# Patient Record
Sex: Female | Born: 2000 | Race: White | Hispanic: No | Marital: Single | Smoking: Never smoker
Health system: Southern US, Community
[De-identification: ages and names within clinical notes are randomized; demographics above are authoritative.]

## PROBLEM LIST (undated history)

## (undated) DIAGNOSIS — Z789 Other specified health status: Secondary | ICD-10-CM

## (undated) HISTORY — PX: NO PAST SURGERIES: SHX2092

## (undated) HISTORY — DX: Other specified health status: Z78.9

---
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
## 2019-02-09 ENCOUNTER — Encounter: Payer: Self-pay | Admitting: Family Medicine

## 2019-02-10 ENCOUNTER — Encounter: Payer: Self-pay | Admitting: Family Medicine

## 2019-02-25 ENCOUNTER — Ambulatory Visit (INDEPENDENT_AMBULATORY_CARE_PROVIDER_SITE_OTHER): Payer: Self-pay | Admitting: Nurse Practitioner

## 2019-02-25 ENCOUNTER — Other Ambulatory Visit: Payer: Self-pay

## 2019-02-25 ENCOUNTER — Encounter: Payer: Self-pay | Admitting: Nurse Practitioner

## 2019-02-25 VITALS — BP 124/86 | Temp 97.4°F | Ht 68.0 in | Wt 166.6 lb

## 2019-02-25 DIAGNOSIS — Z3009 Encounter for other general counseling and advice on contraception: Secondary | ICD-10-CM

## 2019-02-25 DIAGNOSIS — N390 Urinary tract infection, site not specified: Secondary | ICD-10-CM

## 2019-02-25 LAB — POCT URINALYSIS DIPSTICK
Spec Grav, UA: 1.01 (ref 1.010–1.025)
pH, UA: 7 (ref 5.0–8.0)

## 2019-02-25 MED ORDER — MEDROXYPROGESTERONE ACETATE 150 MG/ML IM SUSY: 150.0000 mg | PREFILLED_SYRINGE | INTRAMUSCULAR | 0 refills | Status: DC

## 2019-02-25 MED ORDER — NITROFURANTOIN MONOHYD MACRO 100 MG PO CAPS: 100.0000 mg | ORAL_CAPSULE | Freq: Two times a day (BID) | ORAL | 0 refills | Status: DC

## 2019-02-25 NOTE — Progress Notes (Signed)
   Subjective:    Patient ID: Samantha Patterson, female    DOB: 12-19-2000, 19 y.o.   MRN: [id number]  HPIfrequent urination started 2 days. alos would like to be checked for STD's.  Results for orders placed or performed in visit on 02/25/19  POCT urinalysis dipstick  Result Value Ref Range   Color, UA     Clarity, UA     Glucose, UA     Bilirubin, UA     Ketones, UA     Spec Grav, UA 1.010 1.010 - 1.025   Blood, UA     pH, UA 7.0 5.0 - 8.0   Protein, UA     Urobilinogen, UA     Nitrite, UA     Leukocytes, UA Trace (A) Negative   Appearance     Odor        Review of Systems     Objective:   Physical Exam        Assessment & Plan:

## 2019-02-25 NOTE — Patient Instructions (Signed)
Family Planning Medicaid

## 2019-02-25 NOTE — Progress Notes (Signed)
Subjective:    Patient ID: Samantha Patterson, female    DOB: 06-23-2000, 19 y.o.   MRN: [id number]  HPI  19 year old Caucasian female presents with complaints of urinary frequency times 3 days. Had some slight blood with wiping yesterday but unsure if it was urinary or vaginal. Denies flank pain, burning or itching. States she took OTC ibuprofen yesterday for cramping with minimum effectiveness. Has increased fluids of water and cranberry juice. States she is sexually active with the same female partner for 6 months. Depo-Provera last taken 02/19/19.  Stated she has no medical insurance. No history of recent UTI.   Review of Systems  Constitutional: Negative for activity change, fatigue and fever.  Gastrointestinal: Negative for diarrhea, nausea and vomiting.  Genitourinary: Positive for frequency and urgency. Negative for difficulty urinating, dyspareunia, dysuria, enuresis, flank pain, genital sores, pelvic pain, vaginal discharge and vaginal pain.       Objective:   Physical Exam Constitutional:      Appearance: Normal appearance.  Cardiovascular:     Rate and Rhythm: Normal rate and regular rhythm.  Pulmonary:     Breath sounds: Normal breath sounds.  Abdominal:     General: Abdomen is flat. There is no distension.     Palpations: Abdomen is soft. There is no mass.     Tenderness: There is no abdominal tenderness. There is no guarding.  Neurological:     Mental Status: She is alert.    Defers pelvic exam.  Results for orders placed or performed in visit on 02/25/19  POCT urinalysis dipstick  Result Value Ref Range   Color, UA     Clarity, UA     Glucose, UA     Bilirubin, UA     Ketones, UA     Spec Grav, UA 1.010 1.010 - 1.025   Blood, UA     pH, UA 7.0 5.0 - 8.0   Protein, UA     Urobilinogen, UA     Nitrite, UA     Leukocytes, UA Trace (A) Negative   Appearance     Odor      Urine micro: 0-4 WBC per HPF; no RBC; rare epithelial; urine very dilute         Assessment & Plan:  Urinary tract infection in female - Plan: POCT urinalysis dipstick  Encounter for general counseling and advice on contraceptive management   Meds ordered this encounter  Medications  . medroxyPROGESTERone Acetate 150 MG/ML SUSY    Sig: Inject 1 mL (150 mg total) into the muscle every 3 (three) months.    Dispense:  1 mL    Refill:  0    Please consider 90 day supplies to promote better adherence    Order Specific Question:   Supervising Provider    Answer:   Sallee Lange A [[id number]]  . nitrofurantoin, macrocrystal-monohydrate, (MACROBID) 100 MG capsule    Sig: Take 1 capsule (100 mg total) by mouth 2 (two) times daily.    Dispense:  14 capsule    Refill:  0    Order Specific Question:   Supervising Provider    Answer:   Kathyrn Drown [[id number]]   Defers STD testing at this time due to lack of insurance.  Discussed other contraceptive methods, risks, benefits and potential costs. Patient decided to complete outreach to [facility] services for further contraceptive options and STD evaluation. If she does not qualify for this, to seek care at [facility] due to  finances. Patient noted that she wished to continue Depo-Provera until Family Planing visit.  Warning signs reviewed. Call back in the meantime if symptoms worsen or persist.

## 2019-02-26 ENCOUNTER — Encounter: Payer: Self-pay | Admitting: Nurse Practitioner

## 2019-08-22 ENCOUNTER — Encounter: Payer: Self-pay | Admitting: Adult Health

## 2019-08-22 ENCOUNTER — Ambulatory Visit (INDEPENDENT_AMBULATORY_CARE_PROVIDER_SITE_OTHER): Payer: Self-pay | Admitting: Adult Health

## 2019-08-22 VITALS — BP 122/78 | HR 92 | Ht 68.0 in | Wt 180.0 lb

## 2019-08-22 DIAGNOSIS — Z3A01 Less than 8 weeks gestation of pregnancy: Secondary | ICD-10-CM | POA: Insufficient documentation

## 2019-08-22 DIAGNOSIS — Z3201 Encounter for pregnancy test, result positive: Secondary | ICD-10-CM

## 2019-08-22 DIAGNOSIS — O3680X0 Pregnancy with inconclusive fetal viability, not applicable or unspecified: Secondary | ICD-10-CM

## 2019-08-22 LAB — POCT URINE PREGNANCY: Preg Test, Ur: POSITIVE — AB

## 2019-08-22 MED ORDER — FLINTSTONES COMPLETE 18 MG PO CHEW: CHEWABLE_TABLET | ORAL | Status: DC

## 2019-08-22 NOTE — Progress Notes (Signed)
°  Subjective:     Patient ID: Samantha Patterson, female   DOB: 2000/04/20, 19 y.o.   MRN: [id number]  HPI Samantha Patterson is a 19 year old white female, single,in for UPT has missed a period and had 4 +HPTs.  PCP is Dr Wolfgang Phoenix.  Review of Systems +missed period with 4+HPTs Some nausea Reviewed past medical,surgical, social and family history. Reviewed medications and allergies.     Objective:   Physical Exam BP 122/78 (BP Location: Left Arm, Patient Position: Sitting, Cuff Size: Normal)    Pulse 92    Ht 5\' 8"  (1.727 m)    Wt 180 lb (81.6 kg)    LMP 07/05/2019    BMI 27.37 kg/m UPT is +, about 6+6 weeks by LMP with EDD 04/10/20.   Skin warm and dry. Neck: mid line trachea, normal thyroid, good ROM, no lymphadenopathy noted. Lungs: clear to ausculation bilaterally. Cardiovascular: regular rate and rhythm.Abdomen is soft and non tender Fall risk is low  Upstream - 08/22/19 1607      Pregnancy Intention Screening   Does the patient want to become pregnant in the next year? Yes    Does the patient's partner want to become pregnant in the next year? Yes    Would the patient like to discuss contraceptive options today? No      Contraception Wrap Up   Current Method Pregnant/Seeking Pregnancy    End Method Pregnant/Seeking Pregnancy    Contraception Counseling Provided No             Assessment:     1. Pregnancy examination or test, positive result Try 2 flintstones daily  2. Less than [redacted] weeks gestation of pregnancy Eat small frequent meals  3. Encounter to determine fetal viability of pregnancy, single or unspecified fetus Return in 1 week for dating [place]    Plan:     Review handout on First trimester and by [facility]

## 2019-08-22 NOTE — Patient Instructions (Signed)
First Trimester of Pregnancy The first trimester of pregnancy is from week 1 until the end of week 13 (months 1 through 3). A week after a sperm fertilizes an egg, the egg will implant on the wall of the uterus. This embryo will begin to develop into a baby. Genes from you and your partner will form the baby. The female genes will determine whether the baby will be a boy or a girl. At 6-8 weeks, the eyes and face will be formed, and the heartbeat can be seen on ultrasound. At the end of 12 weeks, all the baby's organs will be formed. Now that you are pregnant, you will want to do everything you can to have a healthy baby. Two of the most important things are to get good prenatal care and to follow your health care provider's instructions. Prenatal care is all the medical care you receive before the baby's birth. This care will help prevent, find, and treat any problems during the pregnancy and childbirth. Body changes during your first trimester Your body goes through many changes during pregnancy. The changes vary from woman to woman.  You may gain or lose a couple of pounds at first.  You may feel sick to your stomach (nauseous) and you may throw up (vomit). If the vomiting is uncontrollable, call your health care provider.  You may tire easily.  You may develop headaches that can be relieved by medicines. All medicines should be approved by your health care provider.  You may urinate more often. Painful urination may mean you have a bladder infection.  You may develop heartburn as a result of your pregnancy.  You may develop constipation because certain hormones are causing the muscles that push stool through your intestines to slow down.  You may develop hemorrhoids or swollen veins (varicose veins).  Your breasts may begin to grow larger and become tender. Your nipples may stick out more, and the tissue that surrounds them (areola) may become darker.  Your gums may bleed and may be  sensitive to brushing and flossing.  Dark spots or blotches (chloasma, mask of pregnancy) may develop on your face. This will likely fade after the baby is born.  Your menstrual periods will stop.  You may have a loss of appetite.  You may develop cravings for certain kinds of food.  You may have changes in your emotions from day to day, such as being excited to be pregnant or being concerned that something may go wrong with the pregnancy and baby.  You may have more vivid and strange dreams.  You may have changes in your hair. These can include thickening of your hair, rapid growth, and changes in texture. Some women also have hair loss during or after pregnancy, or hair that feels dry or thin. Your hair will most likely return to normal after your baby is born. What to expect at prenatal visits During a routine prenatal visit:  You will be weighed to make sure you and the baby are growing normally.  Your blood pressure will be taken.  Your abdomen will be measured to track your baby's growth.  The fetal heartbeat will be listened to between weeks 10 and 14 of your pregnancy.  Test results from any previous visits will be discussed. Your health care provider may ask you:  How you are feeling.  If you are feeling the baby move.  If you have had any abnormal symptoms, such as leaking fluid, bleeding, severe headaches, or abdominal   cramping.  If you are using any tobacco products, including cigarettes, chewing tobacco, and electronic cigarettes.  If you have any questions. Other tests that may be performed during your first trimester include:  Blood tests to find your blood type and to check for the presence of any previous infections. The tests will also be used to check for low iron levels (anemia) and protein on red blood cells (Rh antibodies). Depending on your risk factors, or if you previously had diabetes during pregnancy, you may have tests to check for high blood sugar  that affects pregnant women (gestational diabetes).  Urine tests to check for infections, diabetes, or protein in the urine.  An ultrasound to confirm the proper growth and development of the baby.  Fetal screens for spinal cord problems (spina bifida) and Down syndrome.  HIV (human immunodeficiency virus) testing. Routine prenatal testing includes screening for HIV, unless you choose not to have this test.  You may need other tests to make sure you and the baby are doing well. Follow these instructions at home: Medicines  Follow your health care provider's instructions regarding medicine use. Specific medicines may be either safe or unsafe to take during pregnancy.  Take a prenatal vitamin that contains at least 600 micrograms (mcg) of folic acid.  If you develop constipation, try taking a stool softener if your health care provider approves. Eating and drinking   Eat a balanced diet that includes fresh fruits and vegetables, whole grains, good sources of protein such as meat, eggs, or tofu, and low-fat dairy. Your health care provider will help you determine the amount of weight gain that is right for you.  Avoid raw meat and uncooked cheese. These carry germs that can cause birth defects in the baby.  Eating four or five small meals rather than three large meals a day may help relieve nausea and vomiting. If you start to feel nauseous, eating a few soda crackers can be helpful. Drinking liquids between meals, instead of during meals, also seems to help ease nausea and vomiting.  Limit foods that are high in fat and processed sugars, such as fried and sweet foods.  To prevent constipation: ? Eat foods that are high in fiber, such as fresh fruits and vegetables, whole grains, and beans. ? Drink enough fluid to keep your urine clear or pale yellow. Activity  Exercise only as directed by your health care provider. Most women can continue their usual exercise routine during  pregnancy. Try to exercise for 30 minutes at least 5 days a week. Exercising will help you: ? Control your weight. ? Stay in shape. ? Be prepared for labor and delivery.  Experiencing pain or cramping in the lower abdomen or lower back is a good sign that you should stop exercising. Check with your health care provider before continuing with normal exercises.  Try to avoid standing for long periods of time. Move your legs often if you must stand in one place for a long time.  Avoid heavy lifting.  Wear low-heeled shoes and practice good posture.  You may continue to have sex unless your health care provider tells you not to. Relieving pain and discomfort  Wear a good support bra to relieve breast tenderness.  Take warm sitz baths to soothe any pain or discomfort caused by hemorrhoids. Use hemorrhoid cream if your health care provider approves.  Rest with your legs elevated if you have leg cramps or low back pain.  If you develop varicose veins in   your legs, wear support hose. Elevate your feet for 15 minutes, 3-4 times a day. Limit salt in your diet. Prenatal care  Schedule your prenatal visits by the twelfth week of pregnancy. They are usually scheduled monthly at first, then more often in the last 2 months before delivery.  Write down your questions. Take them to your prenatal visits.  Keep all your prenatal visits as told by your health care provider. This is important. Safety  Wear your seat belt at all times when driving.  Make a list of emergency phone numbers, including numbers for family, friends, the hospital, and police and fire departments. General instructions  Ask your health care provider for a referral to a local prenatal education class. Begin classes no later than the beginning of month 6 of your pregnancy.  Ask for help if you have counseling or nutritional needs during pregnancy. Your health care provider can offer advice or refer you to specialists for help  with various needs.  Do not use hot tubs, steam rooms, or saunas.  Do not douche or use tampons or scented sanitary pads.  Do not cross your legs for long periods of time.  Avoid cat litter boxes and soil used by cats. These carry germs that can cause birth defects in the baby and possibly loss of the fetus by miscarriage or stillbirth.  Avoid all smoking, herbs, alcohol, and medicines not prescribed by your health care provider. Chemicals in these products affect the formation and growth of the baby.  Do not use any products that contain nicotine or tobacco, such as cigarettes and e-cigarettes. If you need help quitting, ask your health care provider. You may receive counseling support and other resources to help you quit.  Schedule a dentist appointment. At home, brush your teeth with a soft toothbrush and be gentle when you floss. Contact a health care provider if:  You have dizziness.  You have mild pelvic cramps, pelvic pressure, or nagging pain in the abdominal area.  You have persistent nausea, vomiting, or diarrhea.  You have a bad smelling vaginal discharge.  You have pain when you urinate.  You notice increased swelling in your face, hands, legs, or ankles.  You are exposed to fifth disease or chickenpox.  You are exposed to German measles (rubella) and have never had it. Get help right away if:  You have a fever.  You are leaking fluid from your vagina.  You have spotting or bleeding from your vagina.  You have severe abdominal cramping or pain.  You have rapid weight gain or loss.  You vomit blood or material that looks like coffee grounds.  You develop a severe headache.  You have shortness of breath.  You have any kind of trauma, such as from a fall or a car accident. Summary  The first trimester of pregnancy is from week 1 until the end of week 13 (months 1 through 3).  Your body goes through many changes during pregnancy. The changes vary from  woman to woman.  You will have routine prenatal visits. During those visits, your health care provider will examine you, discuss any test results you may have, and talk with you about how you are feeling. This information is not intended to replace advice given to you by your health care provider. Make sure you discuss any questions you have with your health care provider. Document Revised: 12/12/2016 Document Reviewed: 12/12/2015 Elsevier Patient Education  2020 [employer]  

## 2019-08-31 ENCOUNTER — Other Ambulatory Visit: Payer: Self-pay

## 2019-09-05 ENCOUNTER — Ambulatory Visit (INDEPENDENT_AMBULATORY_CARE_PROVIDER_SITE_OTHER): Payer: Self-pay

## 2019-09-05 DIAGNOSIS — O3680X0 Pregnancy with inconclusive fetal viability, not applicable or unspecified: Secondary | ICD-10-CM

## 2019-09-05 NOTE — Progress Notes (Signed)
[place] 8+6 wks,single IUP with YS,normal right ovary,left ovary not visualized,crl 19.47 mm,fhr 158 bpm

## 2019-09-30 ENCOUNTER — Other Ambulatory Visit: Payer: Self-pay | Admitting: Obstetrics & Gynecology

## 2019-09-30 DIAGNOSIS — Z3682 Encounter for antenatal screening for nuchal translucency: Secondary | ICD-10-CM

## 2019-10-03 ENCOUNTER — Encounter: Payer: Self-pay | Admitting: Women's Health

## 2019-10-03 ENCOUNTER — Ambulatory Visit (INDEPENDENT_AMBULATORY_CARE_PROVIDER_SITE_OTHER): Payer: Medicaid Other | Admitting: Women's Health

## 2019-10-03 ENCOUNTER — Ambulatory Visit (INDEPENDENT_AMBULATORY_CARE_PROVIDER_SITE_OTHER): Payer: Medicaid Other

## 2019-10-03 ENCOUNTER — Ambulatory Visit: Payer: Medicaid Other | Admitting: *Deleted

## 2019-10-03 VITALS — BP 120/82 | HR 90 | Wt 173.0 lb

## 2019-10-03 DIAGNOSIS — Z3A12 12 weeks gestation of pregnancy: Secondary | ICD-10-CM

## 2019-10-03 DIAGNOSIS — Z3400 Encounter for supervision of normal first pregnancy, unspecified trimester: Secondary | ICD-10-CM | POA: Insufficient documentation

## 2019-10-03 DIAGNOSIS — Z1389 Encounter for screening for other disorder: Secondary | ICD-10-CM | POA: Diagnosis not present

## 2019-10-03 DIAGNOSIS — Z331 Pregnant state, incidental: Secondary | ICD-10-CM

## 2019-10-03 DIAGNOSIS — F419 Anxiety disorder, unspecified: Secondary | ICD-10-CM

## 2019-10-03 DIAGNOSIS — Z3401 Encounter for supervision of normal first pregnancy, first trimester: Secondary | ICD-10-CM

## 2019-10-03 DIAGNOSIS — Z3682 Encounter for antenatal screening for nuchal translucency: Secondary | ICD-10-CM | POA: Diagnosis not present

## 2019-10-03 LAB — POCT URINALYSIS DIPSTICK OB
Blood, UA: NEGATIVE
Glucose, UA: NEGATIVE
Ketones, UA: NEGATIVE
Leukocytes, UA: NEGATIVE
Nitrite, UA: NEGATIVE
POC,PROTEIN,UA: NEGATIVE

## 2019-10-03 MED ORDER — DOXYLAMINE-PYRIDOXINE 10-10 MG PO TBEC: DELAYED_RELEASE_TABLET | ORAL | 6 refills | Status: DC

## 2019-10-03 NOTE — Progress Notes (Signed)
   NURSE VISIT- [facility]  SUBJECTIVE:  Samantha Patterson is a 19 y.o. G1P0 female here for Panorama NIPT and Horizon Carrier Screening . She is [redacted]w[redacted]d pregnant.   OBJECTIVE:  Appears well, in no apparent distress  Blood work drawn from right [facility] without difficulty. 1 attempt(s).   ASSESSMENT: Pregnancy [redacted]w[redacted]d Panorama NIPT and Horizon Carrier Screening  PLAN: [facility] portal information given and instructed patient how to access results   Alice Rieger  10/03/2019 10:32 AM

## 2019-10-03 NOTE — Progress Notes (Signed)
[place] 12+6 wks,measurements c/w dates,crl 70.34 mm,fhr 155 bpm,NB present,NT 1.2 mm,normal ovaries,anterior placenta

## 2019-10-03 NOTE — Progress Notes (Signed)
INITIAL OBSTETRICAL VISIT Patient name: [name] MRN [id number]  Date of birth: [birth year] Chief Complaint:   Initial Prenatal Visit (nt/it)  History of Present Illness:   Samantha Patterson is a 19 y.o. G64P0 Caucasian female at [redacted]w[redacted]d by LMP c/w u/s at 8 weeks with an Estimated Date of Delivery: 04/10/20 being seen today for her initial obstetrical visit.   Her obstetrical history is significant for primigravida.   Today she reports n/v- requests meds.  H/O anxiety- no meds, doing well Depression screen [facility] 2/9 10/03/2019 11/23/2013  Decreased Interest 2 2  Down, Depressed, Hopeless 1 3  PHQ - 2 Score 3 5  Altered sleeping 2 3  Tired, decreased energy 2 2  Change in appetite 2 3  Feeling bad or failure about yourself  2 2  Trouble concentrating 2 2  Moving slowly or fidgety/restless 0 2  Suicidal thoughts 0 0  PHQ-9 Score 13 19    Patient's last menstrual period was 07/05/2019. Last pap <21yo. Results were: n/a Review of Systems:   Pertinent items are noted in HPI Denies cramping/contractions, leakage of fluid, vaginal bleeding, abnormal vaginal discharge w/ itching/odor/irritation, headaches, visual changes, shortness of breath, chest pain, abdominal pain, severe nausea/vomiting, or problems with urination or bowel movements unless otherwise stated above.  Pertinent History Reviewed:  Reviewed past medical,surgical, social, obstetrical and family history.  Reviewed problem list, medications and allergies. OB History  Gravida Para Term Preterm AB Living  1            SAB TAB Ectopic Multiple Live Births               # Outcome Date GA Lbr Len/2nd Weight Sex Delivery Anes PTL Lv  1 Current            Physical Assessment:   Vitals:   10/03/19 1036  BP: 120/82  Pulse: 90  Weight: 173 lb (78.5 kg)  Body mass index is 26.3 kg/m.       Physical Examination:  General appearance - well appearing, and in no distress  Mental status - alert, oriented to person, place, and  time  Psych:  She has a normal mood and affect  Skin - warm and dry, normal color, no suspicious lesions noted  Chest - effort normal, all lung fields clear to auscultation bilaterally  Heart - normal rate and regular rhythm  Abdomen - soft, nontender  Extremities:  No swelling or varicosities noted  Thin prep pap is not done  TODAY'S NT [place] 12+6 wks,measurements c/w dates,crl 70.34 mm,fhr 155 bpm,NB present,NT 1.2 mm,normal ovaries,anterior placenta   Results for orders placed or performed in visit on 10/03/19 (from the past 24 hour(s))  POC Urinalysis Dipstick OB   Collection Time: 10/03/19 10:43 AM  Result Value Ref Range   Color, UA     Clarity, UA     Glucose, UA Negative Negative   Bilirubin, UA     Ketones, UA neg    Spec Grav, UA     Blood, UA neg    pH, UA     POC,PROTEIN,UA Negative Negative, Trace, Small (1+), Moderate (2+), Large (3+), 4+   Urobilinogen, UA     Nitrite, UA neg    Leukocytes, UA Negative Negative   Appearance     Odor      Assessment & Plan:  1) Low-Risk Pregnancy G1P0 at [redacted]w[redacted]d with an Estimated Date of Delivery: 04/10/20   2) Initial OB visit  3) N/V> rx  diclegis  4) H/O anxiety> no meds, doing well  Meds:  Meds ordered this encounter  Medications  . Doxylamine-Pyridoxine (DICLEGIS) 10-10 MG TBEC    Sig: 2 tabs q hs, if sx persist add 1 tab q am on day 3, if sx persist add 1 tab q afternoon on day 4    Dispense:  100 tablet    Refill:  6    Order Specific Question:   Supervising Provider    Answer:   Tania Ade H [[id number]]    Initial labs obtained Continue prenatal vitamins Reviewed n/v relief measures and warning s/s to report Reviewed recommended weight gain based on pre-gravid BMI Encouraged well-balanced diet Genetic & carrier screening discussed: requests Panorama, NT/IT and Horizon 14  Ultrasound discussed; fetal survey: requested CCNC completed> form faxed if has or is planning to apply for medicaid The nature of [employer] for [employer] with multiple MDs and other Advanced Practice Providers was explained to patient; also emphasized that fellows, residents, and students are part of our team. Has home bp cuff.  Check bp weekly, let us know if >140/90.   Follow-up: Return in about 3 weeks (around 10/24/2019) for [place], 2nd IT, in person, CNM.   Orders Placed This Encounter  Procedures  . Urine Culture  . GC/Chlamydia Probe Amp  . Integrated 1  . Genetic Screening  . Pain Management Screening Profile (10S)  . CBC/D/Plt+RPR+Rh+ABO+Rub Ab...  . POC Urinalysis Dipstick OB    [name] CNM, [facility] 10/03/2019 10:56 AM

## 2019-10-03 NOTE — Patient Instructions (Signed)
Samantha Patterson, I greatly value your feedback.  If you receive a survey following your visit with [place] today, we appreciate you taking the time to fill it out.  Thanks, [name], CNM, WHNP-BC   [facility] [place] [facility] ([address], [place] [address]) Entrance C, located off of [address] [place] parking   Nausea & Vomiting  Have saltine crackers or pretzels by your bed and eat a few bites before you raise your head out of bed in the morning  Eat small frequent meals throughout the day instead of large meals  Drink plenty of fluids throughout the day to stay hydrated, just don't drink a lot of fluids with your meals.  This can make your stomach fill up faster making you feel sick  Do not brush your teeth right after you eat  Products with real ginger are good for nausea, like ginger ale and ginger hard candy Make sure it says made with real ginger!  Sucking on sour candy like lemon heads is also good for nausea  If your prenatal vitamins make you nauseated, take them at night so you will sleep through the nausea  Sea Bands  If you feel like you need medicine for the nausea & vomiting please let us know  If you are unable to keep any fluids or food down please let us know   Constipation  Drink plenty of fluid, preferably water, throughout the day  Eat foods high in fiber such as fruits, vegetables, and grains  Exercise, such as walking, is a good way to keep your bowels regular  Drink warm fluids, especially warm prune juice, or decaf coffee  Eat a 1/2 cup of real oatmeal (not instant), 1/2 cup applesauce, and 1/2-1 cup warm prune juice every day  If needed, you may take Colace (docusate sodium) stool softener once or twice a day to help keep the stool soft.   If you still are having problems with constipation, you may take Miralax once daily as needed to help keep your bowels regular.   Home Blood Pressure Monitoring for Patients    Your provider has recommended that you check your blood pressure (BP) at least once a week at home. If you do not have a blood pressure cuff at home, one will be provided for you. Contact your provider if you have not received your monitor within 1 week.   Helpful Tips for Accurate Home Blood Pressure Checks   Don't smoke, exercise, or drink caffeine 30 minutes before checking your BP  Use the restroom before checking your BP (a full bladder can raise your pressure)  Relax in a comfortable upright chair  Feet on the ground  Left arm resting comfortably on a flat surface at the level of your heart  Legs uncrossed  Back supported  Sit quietly and don't talk  Place the cuff on your bare arm  Adjust snuggly, so that only two fingertips can fit between your skin and the top of the cuff  Check 2 readings separated by at least one minute  Keep a log of your BP readings  For a visual, please reference this diagram: http://ccnc.care/bpdiagram  Provider Name: [facility]     Phone: [phone]  Zone 1: ALL CLEAR  Continue to monitor your symptoms:   BP reading is less than 140 (top number) or less than 90 (bottom number)   No right upper stomach pain  No headaches or seeing  spots  No feeling nauseated or throwing up  No swelling in face and hands  Zone 2: CAUTION Call your doctor's office for any of the following:   BP reading is greater than 140 (top number) or greater than 90 (bottom number)   Stomach pain under your ribs in the middle or right side  Headaches or seeing spots  Feeling nauseated or throwing up  Swelling in face and hands  Zone 3: EMERGENCY  Seek immediate medical care if you have any of the following:   BP reading is greater than160 (top number) or greater than 110 (bottom number)  Severe headaches not improving with Tylenol  Serious difficulty catching your breath  Any worsening symptoms from Zone 2    First Trimester of  Pregnancy The first trimester of pregnancy is from week 1 until the end of week 12 (months 1 through 3). A week after a sperm fertilizes an egg, the egg will implant on the wall of the uterus. This embryo will begin to develop into a baby. Genes from you and your partner are forming the baby. The female genes determine whether the baby is a boy or a girl. At 6-8 weeks, the eyes and face are formed, and the heartbeat can be seen on ultrasound. At the end of 12 weeks, all the baby's organs are formed.  Now that you are pregnant, you will want to do everything you can to have a healthy baby. Two of the most important things are to get good prenatal care and to follow your health care provider's instructions. Prenatal care is all the medical care you receive before the baby's birth. This care will help prevent, find, and treat any problems during the pregnancy and childbirth. BODY CHANGES Your body goes through many changes during pregnancy. The changes vary from woman to woman.   You may gain or lose a couple of pounds at first.  You may feel sick to your stomach (nauseous) and throw up (vomit). If the vomiting is uncontrollable, call your health care provider.  You may tire easily.  You may develop headaches that can be relieved by medicines approved by your health care provider.  You may urinate more often. Painful urination may mean you have a bladder infection.  You may develop heartburn as a result of your pregnancy.  You may develop constipation because certain hormones are causing the muscles that push waste through your intestines to slow down.  You may develop hemorrhoids or swollen, bulging veins (varicose veins).  Your breasts may begin to grow larger and become tender. Your nipples may stick out more, and the tissue that surrounds them (areola) may become darker.  Your gums may bleed and may be sensitive to brushing and flossing.  Dark spots or blotches (chloasma, mask of pregnancy)  may develop on your face. This will likely fade after the baby is born.  Your menstrual periods will stop.  You may have a loss of appetite.  You may develop cravings for certain kinds of food.  You may have changes in your emotions from day to day, such as being excited to be pregnant or being concerned that something may go wrong with the pregnancy and baby.  You may have more vivid and strange dreams.  You may have changes in your hair. These can include thickening of your hair, rapid growth, and changes in texture. Some women also have hair loss during or after pregnancy, or hair that feels dry or thin. Your hair  will most likely return to normal after your baby is born. WHAT TO EXPECT AT YOUR PRENATAL VISITS During a routine prenatal visit:  You will be weighed to make sure you and the baby are growing normally.  Your blood pressure will be taken.  Your abdomen will be measured to track your baby's growth.  The fetal heartbeat will be listened to starting around week 10 or 12 of your pregnancy.  Test results from any previous visits will be discussed. Your health care provider may ask you:  How you are feeling.  If you are feeling the baby move.  If you have had any abnormal symptoms, such as leaking fluid, bleeding, severe headaches, or abdominal cramping.  If you have any questions. Other tests that may be performed during your first trimester include:  Blood tests to find your blood type and to check for the presence of any previous infections. They will also be used to check for low iron levels (anemia) and Rh antibodies. Later in the pregnancy, blood tests for diabetes will be done along with other tests if problems develop.  Urine tests to check for infections, diabetes, or protein in the urine.  An ultrasound to confirm the proper growth and development of the baby.  An amniocentesis to check for possible genetic problems.  Fetal screens for spina bifida and  Down syndrome.  You may need other tests to make sure you and the baby are doing well. HOME CARE INSTRUCTIONS  Medicines  Follow your health care provider's instructions regarding medicine use. Specific medicines may be either safe or unsafe to take during pregnancy.  Take your prenatal vitamins as directed.  If you develop constipation, try taking a stool softener if your health care provider approves. Diet  Eat regular, well-balanced meals. Choose a variety of foods, such as meat or vegetable-based protein, fish, milk and low-fat dairy products, vegetables, fruits, and whole grain breads and cereals. Your health care provider will help you determine the amount of weight gain that is right for you.  Avoid raw meat and uncooked cheese. These carry germs that can cause birth defects in the baby.  Eating four or five small meals rather than three large meals a day may help relieve nausea and vomiting. If you start to feel nauseous, eating a few soda crackers can be helpful. Drinking liquids between meals instead of during meals also seems to help nausea and vomiting.  If you develop constipation, eat more high-fiber foods, such as fresh vegetables or fruit and whole grains. Drink enough fluids to keep your urine clear or pale yellow. Activity and Exercise  Exercise only as directed by your health care provider. Exercising will help you:  Control your weight.  Stay in shape.  Be prepared for labor and delivery.  Experiencing pain or cramping in the lower abdomen or low back is a good sign that you should stop exercising. Check with your health care provider before continuing normal exercises.  Try to avoid standing for long periods of time. Move your legs often if you must stand in one place for a long time.  Avoid heavy lifting.  Wear low-heeled shoes, and practice good posture.  You may continue to have sex unless your health care provider directs you otherwise. Relief of Pain  or Discomfort  Wear a good support bra for breast tenderness.    Take warm sitz baths to soothe any pain or discomfort caused by hemorrhoids. Use hemorrhoid cream if your health care provider approves.  Rest with your legs elevated if you have leg cramps or low back pain.  If you develop varicose veins in your legs, wear support hose. Elevate your feet for 15 minutes, 3-4 times a day. Limit salt in your diet. Prenatal Care  Schedule your prenatal visits by the twelfth week of pregnancy. They are usually scheduled monthly at first, then more often in the last 2 months before delivery.  Write down your questions. Take them to your prenatal visits.  Keep all your prenatal visits as directed by your health care provider. Safety  Wear your seat belt at all times when driving.  Make a list of emergency phone numbers, including numbers for family, friends, the hospital, and police and fire departments. General Tips  Ask your health care provider for a referral to a local prenatal education class. Begin classes no later than at the beginning of month 6 of your pregnancy.  Ask for help if you have counseling or nutritional needs during pregnancy. Your health care provider can offer advice or refer you to specialists for help with various needs.  Do not use hot tubs, steam rooms, or saunas.  Do not douche or use tampons or scented sanitary pads.  Do not cross your legs for long periods of time.  Avoid cat litter boxes and soil used by cats. These carry germs that can cause birth defects in the baby and possibly loss of the fetus by miscarriage or stillbirth.  Avoid all smoking, herbs, alcohol, and medicines not prescribed by your health care provider. Chemicals in these affect the formation and growth of the baby.  Schedule a dentist appointment. At home, brush your teeth with a soft toothbrush and be gentle when you floss. SEEK MEDICAL CARE IF:   You have dizziness.  You have mild  pelvic cramps, pelvic pressure, or nagging pain in the abdominal area.  You have persistent nausea, vomiting, or diarrhea.  You have a bad smelling vaginal discharge.  You have pain with urination.  You notice increased swelling in your face, hands, legs, or ankles. SEEK IMMEDIATE MEDICAL CARE IF:   You have a fever.  You are leaking fluid from your vagina.  You have spotting or bleeding from your vagina.  You have severe abdominal cramping or pain.  You have rapid weight gain or loss.  You vomit blood or material that looks like coffee grounds.  You are exposed to [place] measles and have never had them.  You are exposed to fifth disease or chickenpox.  You develop a severe headache.  You have shortness of breath.  You have any kind of trauma, such as from a fall or a car accident. Document Released: 12/24/2000 Document Revised: 05/16/2013 Document Reviewed: 11/09/2012 [facility] Patient Information 2015 [place], [place]. This information is not intended to replace advice given to you by your health care provider. Make sure you discuss any questions you have with your health care provider.

## 2019-10-04 ENCOUNTER — Encounter: Payer: Self-pay | Admitting: Women's Health

## 2019-10-04 DIAGNOSIS — O26899 Other specified pregnancy related conditions, unspecified trimester: Secondary | ICD-10-CM | POA: Insufficient documentation

## 2019-10-05 LAB — CBC/D/PLT+RPR+RH+ABO+RUB AB...
Antibody Screen: NEGATIVE
Basophils Absolute: 0 10*3/uL (ref 0.0–0.2)
Basos: 0 %
EOS (ABSOLUTE): 0.1 10*3/uL (ref 0.0–0.4)
Eos: 1 %
HCV Ab: <0.1 s/co ratio (ref 0.0–0.9)
HIV Screen 4th Generation wRfx: NONREACTIVE
Hematocrit: 37.6 % (ref 34.0–46.6)
Hemoglobin: 13.2 g/dL (ref 11.1–15.9)
Hepatitis B Surface Ag: NEGATIVE
Immature Grans (Abs): 0 10*3/uL (ref 0.0–0.1)
Immature Granulocytes: 0 %
Lymphocytes Absolute: 1.4 10*3/uL (ref 0.7–3.1)
Lymphs: 17 %
MCH: 31.4 pg (ref 26.6–33.0)
MCHC: 35.1 g/dL (ref 31.5–35.7)
MCV: 90 fL (ref 79–97)
Monocytes Absolute: 0.3 10*3/uL (ref 0.1–0.9)
Monocytes: 4 %
Neutrophils Absolute: 6 10*3/uL (ref 1.4–7.0)
Neutrophils: 78 %
Platelets: 155 10*3/uL (ref 150–450)
RBC: 4.2 x10E6/uL (ref 3.77–5.28)
RDW: 12.1 % (ref 11.7–15.4)
RPR Ser Ql: NONREACTIVE
Rh Factor: NEGATIVE
Rubella Antibodies, IGG: 1.84 index (ref >0.99)
WBC: 7.7 10*3/uL (ref 3.4–10.8)

## 2019-10-05 LAB — PMP SCREEN PROFILE (10S), URINE
Amphetamine Scrn, Ur: NEGATIVE ng/mL
BARBITURATE SCREEN URINE: NEGATIVE ng/mL
BENZODIAZEPINE SCREEN, URINE: NEGATIVE ng/mL
CANNABINOIDS UR QL SCN: NEGATIVE ng/mL
Cocaine (Metab) Scrn, Ur: NEGATIVE ng/mL
Creatinine(Crt), U: 199.7 mg/dL (ref 20.0–300.0)
Methadone Screen, Urine: NEGATIVE ng/mL
OXYCODONE+OXYMORPHONE UR QL SCN: NEGATIVE ng/mL
Opiate Scrn, Ur: NEGATIVE ng/mL
Ph of Urine: 5.7 (ref 4.5–8.9)
Phencyclidine Qn, Ur: NEGATIVE ng/mL
Propoxyphene Scrn, Ur: NEGATIVE ng/mL

## 2019-10-05 LAB — INTEGRATED 1
Crown Rump Length: 70.3 mm
Gest. Age on Collection Date: 13 weeks
Maternal Age at EDD: 20.7 yr
Nuchal Translucency (NT): 1.2 mm
Number of Fetuses: 1
PAPP-A Value: 763.9 ng/mL
Weight: 173 [lb_av]

## 2019-10-05 LAB — HCV INTERPRETATION

## 2019-10-05 LAB — URINE CULTURE

## 2019-10-06 LAB — GC/CHLAMYDIA PROBE AMP
Chlamydia trachomatis, NAA: POSITIVE — AB
Neisseria Gonorrhoeae by PCR: NEGATIVE

## 2019-10-10 ENCOUNTER — Other Ambulatory Visit: Payer: Self-pay | Admitting: Women's Health

## 2019-10-10 ENCOUNTER — Encounter: Payer: Self-pay | Admitting: Women's Health

## 2019-10-10 DIAGNOSIS — A749 Chlamydial infection, unspecified: Secondary | ICD-10-CM | POA: Insufficient documentation

## 2019-10-10 MED ORDER — AZITHROMYCIN 500 MG PO TABS: 1000.0000 mg | ORAL_TABLET | Freq: Once | ORAL | 0 refills | Status: AC

## 2019-10-10 NOTE — Progress Notes (Signed)
Called in azithromycin to [facility] for partner:  Samantha Patterson, DOB: March 8th 2001, allergic to Codeine  [name], CNM, [facility] 10/10/2019 3:42 PM

## 2019-10-19 ENCOUNTER — Telehealth: Payer: Self-pay | Admitting: Adult Health

## 2019-10-19 NOTE — Telephone Encounter (Signed)
Pt states that pharmacy did not receive boyfriends perscription. Pt states pharmacy [facility] in [place] is where it needs to be sent to.

## 2019-10-20 ENCOUNTER — Telehealth: Payer: Self-pay

## 2019-10-20 NOTE — Telephone Encounter (Signed)
Pt called back stating that [facility] still does not have the Rx for boyfriend Damon Eames.

## 2019-10-20 NOTE — Telephone Encounter (Signed)
Left message that notes say was called in 9/27 but will call in again today for Samantha Patterson 03/21/1999 to [facility]

## 2019-10-21 ENCOUNTER — Telehealth: Payer: Self-pay

## 2019-10-21 NOTE — Telephone Encounter (Signed)
Damon is calling stating that Samantha Patterson is cough up small amounts blood while she is vomiting. Damon is heading home with Jaynie(he stated they have bad cell service and and the WiFi went out last night). Going to take pt so they have service to send a photo that was taken

## 2019-10-21 NOTE — Telephone Encounter (Signed)
Spoke with Damon; pt don't have a signal at home. Pt has been vomiting frequently and has noticed some blood in vomit. She feels fine otherwise. I advised it sounds like she has some irritation from the frequent vomiting. Pt has an appt here on 10/11 and plans on keeping that appt, JSY

## 2019-10-23 ENCOUNTER — Encounter: Payer: Self-pay | Admitting: *Deleted

## 2019-10-24 ENCOUNTER — Ambulatory Visit (INDEPENDENT_AMBULATORY_CARE_PROVIDER_SITE_OTHER): Payer: Medicaid Other | Admitting: Obstetrics & Gynecology

## 2019-10-24 ENCOUNTER — Encounter: Payer: Self-pay | Admitting: Obstetrics & Gynecology

## 2019-10-24 ENCOUNTER — Other Ambulatory Visit: Payer: Self-pay

## 2019-10-24 VITALS — BP 125/83 | HR 112 | Wt 170.5 lb

## 2019-10-24 DIAGNOSIS — Z1379 Encounter for other screening for genetic and chromosomal anomalies: Secondary | ICD-10-CM

## 2019-10-24 DIAGNOSIS — Z1389 Encounter for screening for other disorder: Secondary | ICD-10-CM

## 2019-10-24 DIAGNOSIS — Z3A15 15 weeks gestation of pregnancy: Secondary | ICD-10-CM

## 2019-10-24 DIAGNOSIS — Z3402 Encounter for supervision of normal first pregnancy, second trimester: Secondary | ICD-10-CM

## 2019-10-24 DIAGNOSIS — Z331 Pregnant state, incidental: Secondary | ICD-10-CM

## 2019-10-24 LAB — POCT URINALYSIS DIPSTICK OB
Blood, UA: NEGATIVE
Glucose, UA: NEGATIVE
Ketones, UA: NEGATIVE
Nitrite, UA: NEGATIVE
POC,PROTEIN,UA: NEGATIVE

## 2019-10-24 NOTE — Progress Notes (Signed)
LOW-RISK PREGNANCY VISIT Patient name: Samantha Patterson MRN [id number]  Date of birth: 04/13/2000 Chief Complaint:   Routine Prenatal Visit (2nd IT; "kidneys hurt" )  History of Present Illness:   Samantha Patterson is a 19 y.o. G1P0 female at [redacted]w[redacted]d with an Estimated Date of Delivery: 04/10/20 being seen today for ongoing management of a low-risk pregnancy.  Depression screen [facility] 2/9 10/03/2019 11/23/2013  Decreased Interest 2 2  Down, Depressed, Hopeless 1 3  PHQ - 2 Score 3 5  Altered sleeping 2 3  Tired, decreased energy 2 2  Change in appetite 2 3  Feeling bad or failure about yourself  2 2  Trouble concentrating 2 2  Moving slowly or fidgety/restless 0 2  Suicidal thoughts 0 0  PHQ-9 Score 13 19    Today she reports pt has bilateral R>L paraspinous muscle pain not renal origin. Contractions: Not present. Vag. Bleeding: None.  Movement: Absent. denies leaking of fluid. Review of Systems:   Pertinent items are noted in HPI Denies abnormal vaginal discharge w/ itching/odor/irritation, headaches, visual changes, shortness of breath, chest pain, abdominal pain, severe nausea/vomiting, or problems with urination or bowel movements unless otherwise stated above. Pertinent History Reviewed:  Reviewed past medical,surgical, social, obstetrical and family history.  Reviewed problem list, medications and allergies. Physical Assessment:   Vitals:   10/24/19 0843  BP: 125/83  Pulse: (!) 112  Weight: 170 lb 8 oz (77.3 kg)  Body mass index is 25.92 kg/m.        Physical Examination:   General appearance: Well appearing, and in no distress  Mental status: Alert, oriented to person, place, and time  Skin: Warm & dry  Cardiovascular: Normal heart rate noted  Respiratory: Normal respiratory effort, no distress  Abdomen: Soft, gravid, nontender  Pelvic: Cervical exam deferred         Extremities: Edema: None  Fetal Status: Fetal Heart Rate (bpm): 128 Fundal Height: 16 cm Movement: Absent     Chaperone: n/a    Results for orders placed or performed in visit on 10/24/19 (from the past 24 hour(s))  POC Urinalysis Dipstick OB   Collection Time: 10/24/19  8:44 AM  Result Value Ref Range   Color, UA     Clarity, UA     Glucose, UA Negative Negative   Bilirubin, UA     Ketones, UA neg    Spec Grav, UA     Blood, UA neg    pH, UA     POC,PROTEIN,UA Negative Negative, Trace, Small (1+), Moderate (2+), Large (3+), 4+   Urobilinogen, UA     Nitrite, UA neg    Leukocytes, UA Trace (A) Negative   Appearance     Odor      Assessment & Plan:  1) Low-risk pregnancy G1P0 at [redacted]w[redacted]d with an Estimated Date of Delivery: 04/10/20   2) Bilateral paraspinous muscle,    Meds: No orders of the defined types were placed in this encounter.  Labs/procedures today:   Plan:  Continue routine obstetrical care  Next visit: prefers in person    Reviewed: Preterm labor symptoms and general obstetric precautions including but not limited to vaginal bleeding, contractions, leaking of fluid and fetal movement were reviewed in detail with the patient.  All questions were answered.  home bp cuff. Rx faxed to . Check bp weekly, let us know if >140/90.   Follow-up: Return in about 4 weeks (around 11/21/2019) for 20 week sono, LROB.  Orders Placed This Encounter  Procedures  . US OB Comp + 14 Wk  . INTEGRATED 2  . POC Urinalysis Dipstick OB   Florian Buff  10/24/2019 10:57 AM

## 2019-10-26 LAB — INTEGRATED 2
AFP MoM: 0.82
Alpha-Fetoprotein: 23.9 ng/mL
Crown Rump Length: 70.3 mm
DIA MoM: 1.29
DIA Value: 190.8 pg/mL
Estriol, Unconjugated: 0.85 ng/mL
Gest. Age on Collection Date: 13 weeks
Gestational Age: 16 weeks
Maternal Age at EDD: 20.7 yr
Nuchal Translucency (NT): 1.2 mm
Nuchal Translucency MoM: 0.75
Number of Fetuses: 1
PAPP-A MoM: 0.75
PAPP-A Value: 763.9 ng/mL
Test Results:: NEGATIVE
Weight: 171 [lb_av]
Weight: 173 [lb_av]
hCG MoM: 1.63
hCG Value: 55.5 IU/mL
uE3 MoM: 0.94

## 2019-11-18 ENCOUNTER — Other Ambulatory Visit: Payer: Self-pay | Admitting: Obstetrics & Gynecology

## 2019-11-18 DIAGNOSIS — Z363 Encounter for antenatal screening for malformations: Secondary | ICD-10-CM

## 2019-11-18 DIAGNOSIS — Z3402 Encounter for supervision of normal first pregnancy, second trimester: Secondary | ICD-10-CM

## 2019-11-21 ENCOUNTER — Ambulatory Visit (INDEPENDENT_AMBULATORY_CARE_PROVIDER_SITE_OTHER): Payer: Medicaid Other

## 2019-11-21 ENCOUNTER — Ambulatory Visit (INDEPENDENT_AMBULATORY_CARE_PROVIDER_SITE_OTHER): Payer: Medicaid Other | Admitting: Women's Health

## 2019-11-21 ENCOUNTER — Other Ambulatory Visit: Payer: Self-pay

## 2019-11-21 ENCOUNTER — Encounter: Payer: Self-pay | Admitting: Women's Health

## 2019-11-21 VITALS — BP 134/82 | HR 101 | Wt 175.0 lb

## 2019-11-21 DIAGNOSIS — Z363 Encounter for antenatal screening for malformations: Secondary | ICD-10-CM

## 2019-11-21 DIAGNOSIS — Z3402 Encounter for supervision of normal first pregnancy, second trimester: Secondary | ICD-10-CM

## 2019-11-21 DIAGNOSIS — Z1389 Encounter for screening for other disorder: Secondary | ICD-10-CM

## 2019-11-21 DIAGNOSIS — Z3A19 19 weeks gestation of pregnancy: Secondary | ICD-10-CM

## 2019-11-21 DIAGNOSIS — Z331 Pregnant state, incidental: Secondary | ICD-10-CM

## 2019-11-21 DIAGNOSIS — A749 Chlamydial infection, unspecified: Secondary | ICD-10-CM

## 2019-11-21 LAB — POCT URINALYSIS DIPSTICK OB
Blood, UA: NEGATIVE
Glucose, UA: NEGATIVE
Ketones, UA: NEGATIVE
Leukocytes, UA: NEGATIVE
Nitrite, UA: NEGATIVE
POC,PROTEIN,UA: NEGATIVE

## 2019-11-21 NOTE — Patient Instructions (Signed)
Samantha Patterson, I greatly value your feedback.  If you receive a survey following your visit with [place] today, we appreciate you taking the time to fill it out.  Thanks, [name], CNM, WHNP-BC  [facility] [place] [facility] ([place], [place] [address]) Entrance C, located off of [address] parking  Go to [email or web address] to register for FREE online childbirth classes  [place] Pediatricians/Family Doctors:  [facility] [place] [phone]                 [place] [phone] (usually not accepting new patients unless you have family there already, you are always welcome to call and ask)       [facility] Department [phone]       [facility] Pediatricians/Family Doctors:   [facility]: [phone]  [facility]: [phone]  [facility]: [place] Doctors:   [facility]: [facility]: [place]:  [place]: [phone]    Home Blood Pressure Monitoring for Patients   Your provider has recommended that you check your blood pressure (BP) at least once a week at home. If you do not have a blood pressure cuff at home, one will be provided for you. Contact your provider if you have not received your monitor within 1 week.   Helpful Tips for Accurate Home Blood Pressure Checks  . Don't smoke, exercise, or drink caffeine 30 minutes before checking your BP . Use the restroom before checking your BP (a full bladder can raise your pressure) . Relax in a comfortable upright chair . Feet on the ground . Left arm resting comfortably on a flat surface at the level of your heart . Legs uncrossed . Back supported . Sit quietly and don't talk . Place the cuff on your bare arm . Adjust snuggly, so that  only two fingertips can fit between your skin and the top of the cuff . Check 2 readings separated by at least one minute . Keep a log of your BP readings . For a visual, please reference this diagram: http://ccnc.care/bpdiagram  Provider Name: [facility]     Phone: [phone]  Zone 1: ALL CLEAR  Continue to monitor your symptoms:  . BP reading is less than 140 (top number) or less than 90 (bottom number)  . No right upper stomach pain . No headaches or seeing spots . No feeling nauseated or throwing up . No swelling in face and hands  Zone 2: CAUTION Call your doctor's office for any of the following:  . BP reading is greater than 140 (top number) or greater than 90 (bottom number)  . Stomach pain under your ribs in the middle or right side . Headaches or seeing spots . Feeling nauseated or throwing up . Swelling in face and hands  Zone 3: EMERGENCY  Seek immediate medical care if you have any of the following:  . BP reading is greater than160 (top number) or greater than 110 (bottom number) . Severe headaches not improving with Tylenol . Serious difficulty catching your breath . Any worsening symptoms from Zone 2     Second Trimester of Pregnancy The second trimester is from week 14 through week 27 (months 4 through 6). The second trimester is often a time when you feel your best.  Your body has adjusted to being pregnant, and you begin to feel better physically. Usually, morning sickness has lessened or quit completely, you may have more energy, and you may have an increase in appetite. The second trimester is also a time when the fetus is growing rapidly. At the end of the sixth month, the fetus is about 9 inches long and weighs about 1 pounds. You will likely begin to feel the baby move (quickening) between 16 and 20 weeks of pregnancy. Body changes during your second trimester Your body continues to go through many changes during your second trimester. The changes  vary from woman to woman.  Your weight will continue to increase. You will notice your lower abdomen bulging out.  You may begin to get stretch marks on your hips, abdomen, and breasts.  You may develop headaches that can be relieved by medicines. The medicines should be approved by your health care provider.  You may urinate more often because the fetus is pressing on your bladder.  You may develop or continue to have heartburn as a result of your pregnancy.  You may develop constipation because certain hormones are causing the muscles that push waste through your intestines to slow down.  You may develop hemorrhoids or swollen, bulging veins (varicose veins).  You may have back pain. This is caused by: ? Weight gain. ? Pregnancy hormones that are relaxing the joints in your pelvis. ? A shift in weight and the muscles that support your balance.  Your breasts will continue to grow and they will continue to become tender.  Your gums may bleed and may be sensitive to brushing and flossing.  Dark spots or blotches (chloasma, mask of pregnancy) may develop on your face. This will likely fade after the baby is born.  A dark line from your belly button to the pubic area (linea nigra) may appear. This will likely fade after the baby is born.  You may have changes in your hair. These can include thickening of your hair, rapid growth, and changes in texture. Some women also have hair loss during or after pregnancy, or hair that feels dry or thin. Your hair will most likely return to normal after your baby is born.  What to expect at prenatal visits During a routine prenatal visit:  You will be weighed to make sure you and the fetus are growing normally.  Your blood pressure will be taken.  Your abdomen will be measured to track your baby's growth.  The fetal heartbeat will be listened to.  Any test results from the previous visit will be discussed.  Your health care provider may  ask you:  How you are feeling.  If you are feeling the baby move.  If you have had any abnormal symptoms, such as leaking fluid, bleeding, severe headaches, or abdominal cramping.  If you are using any tobacco products, including cigarettes, chewing tobacco, and electronic cigarettes.  If you have any questions.  Other tests that may be performed during your second trimester include:  Blood tests that check for: ? Low iron levels (anemia). ? High blood sugar that affects pregnant women (gestational diabetes) between 73 and 28 weeks. ? Rh antibodies. This is to check for a protein on red blood cells (Rh factor).  Urine tests to check for infections, diabetes, or protein in the urine.  An ultrasound to confirm the proper growth and development of the baby.  An amniocentesis to check for possible genetic problems.  Fetal screens  for spina bifida and Down syndrome.  HIV (human immunodeficiency virus) testing. Routine prenatal testing includes screening for HIV, unless you choose not to have this test.  Follow these instructions at home: Medicines  Follow your health care provider's instructions regarding medicine use. Specific medicines may be either safe or unsafe to take during pregnancy.  Take a prenatal vitamin that contains at least 600 micrograms (mcg) of folic acid.  If you develop constipation, try taking a stool softener if your health care provider approves. Eating and drinking  Eat a balanced diet that includes fresh fruits and vegetables, whole grains, good sources of protein such as meat, eggs, or tofu, and low-fat dairy. Your health care provider will help you determine the amount of weight gain that is right for you.  Avoid raw meat and uncooked cheese. These carry germs that can cause birth defects in the baby.  If you have low calcium intake from food, talk to your health care provider about whether you should take a daily calcium supplement.  Limit foods  that are high in fat and processed sugars, such as fried and sweet foods.  To prevent constipation: ? Drink enough fluid to keep your urine clear or pale yellow. ? Eat foods that are high in fiber, such as fresh fruits and vegetables, whole grains, and beans. Activity  Exercise only as directed by your health care provider. Most women can continue their usual exercise routine during pregnancy. Try to exercise for 30 minutes at least 5 days a week. Stop exercising if you experience uterine contractions.  Avoid heavy lifting, wear low heel shoes, and practice good posture.  A sexual relationship may be continued unless your health care provider directs you otherwise. Relieving pain and discomfort  Wear a good support bra to prevent discomfort from breast tenderness.  Take warm sitz baths to soothe any pain or discomfort caused by hemorrhoids. Use hemorrhoid cream if your health care provider approves.  Rest with your legs elevated if you have leg cramps or low back pain.  If you develop varicose veins, wear support hose. Elevate your feet for 15 minutes, 3-4 times a day. Limit salt in your diet. Prenatal Care  Write down your questions. Take them to your prenatal visits.  Keep all your prenatal visits as told by your health care provider. This is important. Safety  Wear your seat belt at all times when driving.  Make a list of emergency phone numbers, including numbers for family, friends, the hospital, and police and fire departments. General instructions  Ask your health care provider for a referral to a local prenatal education class. Begin classes no later than the beginning of month 6 of your pregnancy.  Ask for help if you have counseling or nutritional needs during pregnancy. Your health care provider can offer advice or refer you to specialists for help with various needs.  Do not use hot tubs, steam rooms, or saunas.  Do not douche or use tampons or scented sanitary  pads.  Do not cross your legs for long periods of time.  Avoid cat litter boxes and soil used by cats. These carry germs that can cause birth defects in the baby and possibly loss of the fetus by miscarriage or stillbirth.  Avoid all smoking, herbs, alcohol, and unprescribed drugs. Chemicals in these products can affect the formation and growth of the baby.  Do not use any products that contain nicotine or tobacco, such as cigarettes and e-cigarettes. If you need help quitting,  ask your health care provider.  Visit your dentist if you have not gone yet during your pregnancy. Use a soft toothbrush to brush your teeth and be gentle when you floss. Contact a health care provider if:  You have dizziness.  You have mild pelvic cramps, pelvic pressure, or nagging pain in the abdominal area.  You have persistent nausea, vomiting, or diarrhea.  You have a bad smelling vaginal discharge.  You have pain when you urinate. Get help right away if:  You have a fever.  You are leaking fluid from your vagina.  You have spotting or bleeding from your vagina.  You have severe abdominal cramping or pain.  You have rapid weight gain or weight loss.  You have shortness of breath with chest pain.  You notice sudden or extreme swelling of your face, hands, ankles, feet, or legs.  You have not felt your baby move in over an hour.  You have severe headaches that do not go away when you take medicine.  You have vision changes. Summary  The second trimester is from week 14 through week 27 (months 4 through 6). It is also a time when the fetus is growing rapidly.  Your body goes through many changes during pregnancy. The changes vary from woman to woman.  Avoid all smoking, herbs, alcohol, and unprescribed drugs. These chemicals affect the formation and growth your baby.  Do not use any tobacco products, such as cigarettes, chewing tobacco, and e-cigarettes. If you need help quitting, ask your  health care provider.  Contact your health care provider if you have any questions. Keep all prenatal visits as told by your health care provider. This is important. This information is not intended to replace advice given to you by your health care provider. Make sure you discuss any questions you have with your health care provider. Document Released: 12/24/2000 Document Revised: 06/07/2015 Document Reviewed: 03/02/2012 Elsevier Interactive Patient Education  2017 [employer].

## 2019-11-21 NOTE — Progress Notes (Signed)
[place] 19+6 wks,breech,anterior placenta gr 0,normal ovaries,svp of fluid 5.4 cm,cx 2.7 cm,fhr 150 bpm,EFW 332 g 59%,anatomy complete,no obvious abnormalities

## 2019-11-21 NOTE — Progress Notes (Signed)
LOW-RISK PREGNANCY VISIT Patient name: Samantha Patterson MRN [id number]  Date of birth: [birth year] Chief Complaint:   Routine Prenatal Visit  History of Present Illness:   Samantha Patterson is a 19 y.o. G1P0 female at [redacted]w[redacted]d with an Estimated Date of Delivery: 04/10/20 being seen today for ongoing management of a low-risk pregnancy.  Depression screen [facility] 2/9 10/03/2019 11/23/2013  Decreased Interest 2 2  Down, Depressed, Hopeless 1 3  PHQ - 2 Score 3 5  Altered sleeping 2 3  Tired, decreased energy 2 2  Change in appetite 2 3  Feeling bad or failure about yourself  2 2  Trouble concentrating 2 2  Moving slowly or fidgety/restless 0 2  Suicidal thoughts 0 0  PHQ-9 Score 13 19    Today she reports no complaints. Contractions: Not present. Vag. Bleeding: None.  Movement: Absent. denies leaking of fluid. Review of Systems:   Pertinent items are noted in HPI Denies abnormal vaginal discharge w/ itching/odor/irritation, headaches, visual changes, shortness of breath, chest pain, abdominal pain, severe nausea/vomiting, or problems with urination or bowel movements unless otherwise stated above. Pertinent History Reviewed:  Reviewed past medical,surgical, social, obstetrical and family history.  Reviewed problem list, medications and allergies. Physical Assessment:   Vitals:   11/21/19 1110  BP: 134/82  Pulse: (!) 101  Weight: 175 lb (79.4 kg)  Body mass index is 26.61 kg/m.        Physical Examination:   General appearance: Well appearing, and in no distress  Mental status: Alert, oriented to person, place, and time  Skin: Warm & dry  Cardiovascular: Normal heart rate noted  Respiratory: Normal respiratory effort, no distress  Abdomen: Soft, gravid, nontender  Pelvic: Cervical exam deferred         Extremities: Edema: None  Fetal Status: Fetal Heart Rate (bpm): 150 u/s   Movement: Absent   [place] 19+6 wks,breech,anterior placenta gr 0,normal ovaries,svp of fluid 5.4 cm,cx 2.7 cm,fhr 150  bpm,EFW 332 g 59%,anatomy complete,no obvious abnormalities   Chaperone: N/A   Results for orders placed or performed in visit on 11/21/19 (from the past 24 hour(s))  POC Urinalysis Dipstick OB   Collection Time: 11/21/19 11:10 AM  Result Value Ref Range   Color, UA     Clarity, UA     Glucose, UA Negative Negative   Bilirubin, UA     Ketones, UA neg    Spec Grav, UA     Blood, UA neg    pH, UA     POC,PROTEIN,UA Negative Negative, Trace, Small (1+), Moderate (2+), Large (3+), 4+   Urobilinogen, UA     Nitrite, UA neg    Leukocytes, UA Negative Negative   Appearance     Odor      Assessment & Plan:  1) Low-risk pregnancy G1P0 at [redacted]w[redacted]d with an Estimated Date of Delivery: 04/10/20   2) Recent +CT, POC today   Meds: No orders of the defined types were placed in this encounter.  Labs/procedures today: u/s, declined flu shot  Plan:  Continue routine obstetrical care  Next visit: prefers in person    Reviewed: Preterm labor symptoms and general obstetric precautions including but not limited to vaginal bleeding, contractions, leaking of fluid and fetal movement were reviewed in detail with the patient.  All questions were answered. Has home bp cuff.  Check bp weekly, let us know if >140/90.   Follow-up: Return in about 4 weeks (around 12/19/2019) for [place], in person.  Orders  Placed This Encounter  Procedures  . GC/Chlamydia Probe Amp  . POC Urinalysis Dipstick OB   [name] CNM, [facility] 11/21/2019 11:41 AM

## 2019-11-25 LAB — GC/CHLAMYDIA PROBE AMP
Chlamydia trachomatis, NAA: NEGATIVE
Neisseria Gonorrhoeae by PCR: NEGATIVE

## 2019-12-20 ENCOUNTER — Other Ambulatory Visit: Payer: Self-pay

## 2019-12-20 ENCOUNTER — Encounter: Payer: Self-pay | Admitting: Women's Health

## 2019-12-20 ENCOUNTER — Ambulatory Visit (INDEPENDENT_AMBULATORY_CARE_PROVIDER_SITE_OTHER): Payer: Medicaid Other | Admitting: Women's Health

## 2019-12-20 VITALS — BP 129/81 | HR 97 | Wt 178.0 lb

## 2019-12-20 DIAGNOSIS — Z1389 Encounter for screening for other disorder: Secondary | ICD-10-CM

## 2019-12-20 DIAGNOSIS — Z3A24 24 weeks gestation of pregnancy: Secondary | ICD-10-CM

## 2019-12-20 DIAGNOSIS — Z331 Pregnant state, incidental: Secondary | ICD-10-CM

## 2019-12-20 DIAGNOSIS — Z3402 Encounter for supervision of normal first pregnancy, second trimester: Secondary | ICD-10-CM

## 2019-12-20 LAB — POCT URINALYSIS DIPSTICK OB
Blood, UA: NEGATIVE
Glucose, UA: NEGATIVE
Ketones, UA: NEGATIVE
Leukocytes, UA: NEGATIVE
Nitrite, UA: NEGATIVE
POC,PROTEIN,UA: NEGATIVE

## 2019-12-20 NOTE — Progress Notes (Signed)
LOW-RISK PREGNANCY VISIT Patient name: Samantha Patterson MRN [id number]  Date of birth: 2000/05/23 Chief Complaint:   Routine Prenatal Visit  History of Present Illness:   Samantha Patterson is a 19 y.o. G1P0 female at [redacted]w[redacted]d with an Estimated Date of Delivery: 04/10/20 being seen today for ongoing management of a low-risk pregnancy.  Depression screen [facility] 2/9 10/03/2019 11/23/2013  Decreased Interest 2 2  Down, Depressed, Hopeless 1 3  PHQ - 2 Score 3 5  Altered sleeping 2 3  Tired, decreased energy 2 2  Change in appetite 2 3  Feeling bad or failure about yourself  2 2  Trouble concentrating 2 2  Moving slowly or fidgety/restless 0 2  Suicidal thoughts 0 0  PHQ-9 Score 13 19    Today she reports no complaints. Contractions: Not present. Vag. Bleeding: None.  Movement: Present. denies leaking of fluid. Review of Systems:   Pertinent items are noted in HPI Denies abnormal vaginal discharge w/ itching/odor/irritation, headaches, visual changes, shortness of breath, chest pain, abdominal pain, severe nausea/vomiting, or problems with urination or bowel movements unless otherwise stated above. Pertinent History Reviewed:  Reviewed past medical,surgical, social, obstetrical and family history.  Reviewed problem list, medications and allergies. Physical Assessment:   Vitals:   12/20/19 1127  BP: 129/81  Pulse: 97  Weight: 178 lb (80.7 kg)  Body mass index is 27.06 kg/m.        Physical Examination:   General appearance: Well appearing, and in no distress  Mental status: Alert, oriented to person, place, and time  Skin: Warm & dry  Cardiovascular: Normal heart rate noted  Respiratory: Normal respiratory effort, no distress  Abdomen: Soft, gravid, nontender  Pelvic: Cervical exam deferred         Extremities: Edema: None  Fetal Status: Fetal Heart Rate (bpm): 158 Fundal Height: 22 cm Movement: Present    Chaperone: N/A   Results for orders placed or performed in visit on 12/20/19  (from the past 24 hour(s))  POC Urinalysis Dipstick OB   Collection Time: 12/20/19 11:25 AM  Result Value Ref Range   Color, UA     Clarity, UA     Glucose, UA Negative Negative   Bilirubin, UA     Ketones, UA neg    Spec Grav, UA     Blood, UA neg    pH, UA     POC,PROTEIN,UA Negative Negative, Trace, Small (1+), Moderate (2+), Large (3+), 4+   Urobilinogen, UA     Nitrite, UA neg    Leukocytes, UA Negative Negative   Appearance     Odor      Assessment & Plan:  1) Low-risk pregnancy G1P0 at [redacted]w[redacted]d with an Estimated Date of Delivery: 04/10/20    Meds: No orders of the defined types were placed in this encounter.  Labs/procedures today: none  Plan:  Continue routine obstetrical care  Next visit: prefers will be in person for pn2    Reviewed: Preterm labor symptoms and general obstetric precautions including but not limited to vaginal bleeding, contractions, leaking of fluid and fetal movement were reviewed in detail with the patient.  All questions were answered. Has home bp cuff.  Check bp weekly, let us know if >140/90.   Follow-up: Return in about 4 weeks (around 01/17/2020) for LROB, PN2, in person, CNM.  No future appointments.  Orders Placed This Encounter  Procedures  . POC Urinalysis Dipstick OB   [name] CNM, [facility] 12/20/2019 11:37 AM

## 2019-12-20 NOTE — Patient Instructions (Signed)
Samantha Patterson, I greatly value your feedback.  If you receive a survey following your visit with [place] today, we appreciate you taking the time to fill it out.  Thanks, [name], CNM, WHNP-BC   You will have your sugar test next visit.  Please do not eat or drink anything after midnight the night before you come, not even water.  You will be here for at least two hours.  Please make an appointment online for the bloodwork at [email or web address] for 8:30am (or as close to this as possible). Make sure you select the [facility] service center. The day of the appointment, check in with our office first, then you will go to [place] to start the sugar test.    [place] [facility] [place] [address]) Entrance C, located off of [address] parking  Go to [email or web address] to register for FREE online childbirth classes   Call the office [phone]) or go to [facility] if:  You begin to have strong, frequent contractions  Your water breaks.  Sometimes it is a big gush of fluid, sometimes it is just a trickle that keeps getting your panties wet or running down your legs  You have vaginal bleeding.  It is normal to have a small amount of spotting if your cervix was checked.   You don't feel your baby moving like normal.  If you don't, get you something to eat and drink and lay down and focus on feeling your baby move.   If your baby is still not moving like normal, you should call the office or go to [place] [facility]:  [place] [phone]                 [place] [phone] (usually not accepting new patients unless you have family there already, you are always welcome to call and ask)       [facility] Department [phone]       [facility] Pediatricians/Family Doctors:   [facility]:  [phone]  [facility]: [phone]  [facility]: [place] Doctors:   [facility]: [facility]: [place]:  [place]: [phone]   Home Blood Pressure Monitoring for Patients   Your provider has recommended that you check your blood pressure (BP) at least once a week at home. If you do not have a blood pressure cuff at home, one will be provided for you. Contact your provider if you have not received your monitor within 1 week.   Helpful Tips for Accurate Home Blood Pressure Checks  . Don't smoke, exercise, or drink caffeine 30 minutes before checking your BP . Use the restroom before checking your BP (a full bladder can raise your pressure) . Relax in a comfortable upright chair . Feet on the ground . Left arm resting comfortably on a flat surface at the level of your heart . Legs uncrossed . Back supported . Sit quietly and don't talk . Place the cuff on your bare arm . Adjust snuggly, so that only two fingertips can fit between your skin and the top of the cuff . Check 2 readings separated by at least one minute . Keep a log of your BP readings . For a visual, please reference  this diagram: http://ccnc.care/bpdiagram  Provider Name: [facility]     Phone: [phone]  Zone 1: ALL CLEAR  Continue to monitor your symptoms:  . BP reading is less than 140 (top number) or less than 90 (bottom number)  . No right upper stomach pain . No headaches or seeing spots . No feeling nauseated or throwing up . No swelling in face and hands  Zone 2: CAUTION Call your doctor's office for any of the following:  . BP reading is greater than 140 (top number) or greater than 90 (bottom number)  . Stomach pain under your ribs in the middle or right side . Headaches or seeing spots . Feeling nauseated or throwing up . Swelling in  face and hands  Zone 3: EMERGENCY  Seek immediate medical care if you have any of the following:  . BP reading is greater than160 (top number) or greater than 110 (bottom number) . Severe headaches not improving with Tylenol . Serious difficulty catching your breath . Any worsening symptoms from Zone 2   Second Trimester of Pregnancy The second trimester is from week 13 through week 28, months 4 through 6. The second trimester is often a time when you feel your best. Your body has also adjusted to being pregnant, and you begin to feel better physically. Usually, morning sickness has lessened or quit completely, you may have more energy, and you may have an increase in appetite. The second trimester is also a time when the fetus is growing rapidly. At the end of the sixth month, the fetus is about 9 inches long and weighs about 1 pounds. You will likely begin to feel the baby move (quickening) between 18 and 20 weeks of the pregnancy. BODY CHANGES Your body goes through many changes during pregnancy. The changes vary from woman to woman.   Your weight will continue to increase. You will notice your lower abdomen bulging out.  You may begin to get stretch marks on your hips, abdomen, and breasts.  You may develop headaches that can be relieved by medicines approved by your health care provider.  You may urinate more often because the fetus is pressing on your bladder.  You may develop or continue to have heartburn as a result of your pregnancy.  You may develop constipation because certain hormones are causing the muscles that push waste through your intestines to slow down.  You may develop hemorrhoids or swollen, bulging veins (varicose veins).  You may have back pain because of the weight gain and pregnancy hormones relaxing your joints between the bones in your pelvis and as a result of a shift in weight and the muscles that support your balance.  Your breasts will continue to grow  and be tender.  Your gums may bleed and may be sensitive to brushing and flossing.  Dark spots or blotches (chloasma, mask of pregnancy) may develop on your face. This will likely fade after the baby is born.  A dark line from your belly button to the pubic area (linea nigra) may appear. This will likely fade after the baby is born.  You may have changes in your hair. These can include thickening of your hair, rapid growth, and changes in texture. Some women also have hair loss during or after pregnancy, or hair that feels dry or thin. Your hair will most likely return to normal after your baby is born. WHAT TO EXPECT AT YOUR PRENATAL VISITS During a routine prenatal visit:  You will  be weighed to make sure you and the fetus are growing normally.  Your blood pressure will be taken.  Your abdomen will be measured to track your baby's growth.  The fetal heartbeat will be listened to.  Any test results from the previous visit will be discussed. Your health care provider may ask you:  How you are feeling.  If you are feeling the baby move.  If you have had any abnormal symptoms, such as leaking fluid, bleeding, severe headaches, or abdominal cramping.  If you have any questions. Other tests that may be performed during your second trimester include:  Blood tests that check for:  Low iron levels (anemia).  Gestational diabetes (between 24 and 28 weeks).  Rh antibodies.  Urine tests to check for infections, diabetes, or protein in the urine.  An ultrasound to confirm the proper growth and development of the baby.  An amniocentesis to check for possible genetic problems.  Fetal screens for spina bifida and Down syndrome. HOME CARE INSTRUCTIONS   Avoid all smoking, herbs, alcohol, and unprescribed drugs. These chemicals affect the formation and growth of the baby.  Follow your health care provider's instructions regarding medicine use. There are medicines that are either  safe or unsafe to take during pregnancy.  Exercise only as directed by your health care provider. Experiencing uterine cramps is a good sign to stop exercising.  Continue to eat regular, healthy meals.  Wear a good support bra for breast tenderness.  Do not use hot tubs, steam rooms, or saunas.  Wear your seat belt at all times when driving.  Avoid raw meat, uncooked cheese, cat litter boxes, and soil used by cats. These carry germs that can cause birth defects in the baby.  Take your prenatal vitamins.  Try taking a stool softener (if your health care provider approves) if you develop constipation. Eat more high-fiber foods, such as fresh vegetables or fruit and whole grains. Drink plenty of fluids to keep your urine clear or pale yellow.  Take warm sitz baths to soothe any pain or discomfort caused by hemorrhoids. Use hemorrhoid cream if your health care provider approves.  If you develop varicose veins, wear support hose. Elevate your feet for 15 minutes, 3-4 times a day. Limit salt in your diet.  Avoid heavy lifting, wear low heel shoes, and practice good posture.  Rest with your legs elevated if you have leg cramps or low back pain.  Visit your dentist if you have not gone yet during your pregnancy. Use a soft toothbrush to brush your teeth and be gentle when you floss.  A sexual relationship may be continued unless your health care provider directs you otherwise.  Continue to go to all your prenatal visits as directed by your health care provider. SEEK MEDICAL CARE IF:   You have dizziness.  You have mild pelvic cramps, pelvic pressure, or nagging pain in the abdominal area.  You have persistent nausea, vomiting, or diarrhea.  You have a bad smelling vaginal discharge.  You have pain with urination. SEEK IMMEDIATE MEDICAL CARE IF:   You have a fever.  You are leaking fluid from your vagina.  You have spotting or bleeding from your vagina.  You have severe  abdominal cramping or pain.  You have rapid weight gain or loss.  You have shortness of breath with chest pain.  You notice sudden or extreme swelling of your face, hands, ankles, feet, or legs.  You have not felt your baby move in  over an hour.  You have severe headaches that do not go away with medicine.  You have vision changes. Document Released: 12/24/2000 Document Revised: 01/04/2013 Document Reviewed: 03/02/2012 [facility] Patient Information 2015 [place], [place]. This information is not intended to replace advice given to you by your health care provider. Make sure you discuss any questions you have with your health care provider.

## 2020-01-14 NOTE — L&D Delivery Note (Addendum)
OB Delivery Note Samantha Patterson is a 20 y.o. s/p vaginal delivery at [redacted]w[redacted]d.  She was admitted for IOL in the setting of gHTN.    ROM: 5h 83m, AROM with clear fluid GBS Status: Negative Maximum Maternal Temperature: 99.69F   Labor Progress: Patient presented for IOL due to gHTN. Her cervix was favorable on admission, so pitocin was initiated and titrated up. Patient progressed well and AROM was performed for clear fluid. Patient was then noted to have complete cervical dilation and delivered without complication as noted below.   Delivery Date/Time: 04/05/2020 at 1631 Delivery: Called to room and patient was complete and pushing. Head delivered LOA. No nuchal cord present. Shoulder and body delivered in usual fashion with R compound arm and loose body cord x1. Infant with spontaneous cry, placed on mother's abdomen, dried and stimulated. Cord clamped x 2 after 1-minute delay, and cut by FOB under my direct supervision. Cord blood drawn. Placenta delivered spontaneously with gentle cord traction. Fundus firm with massage and Pitocin. Labia, perineum, vagina, and cervix were inspected; no lacerations visualized.    Placenta: intact, 3-vessel cord, sent to L&D Complications: none Lacerations: none EBL: 50 ml Analgesia: epidural   Infant: female  APGARs 9 & 9  weight per medical record  Alcus Dad, MD   Midwife attestation: I was gloved and present for delivery in its entirety and I agree with the above resident's note.  Julianne Handler, CNM 5:01 PM

## 2020-01-18 ENCOUNTER — Encounter: Payer: Self-pay | Admitting: *Deleted

## 2020-01-18 DIAGNOSIS — Z3403 Encounter for supervision of normal first pregnancy, third trimester: Secondary | ICD-10-CM

## 2020-01-19 ENCOUNTER — Other Ambulatory Visit: Payer: Medicaid Other

## 2020-01-19 ENCOUNTER — Encounter: Payer: Medicaid Other | Admitting: Advanced Practice Midwife

## 2020-01-25 ENCOUNTER — Ambulatory Visit (INDEPENDENT_AMBULATORY_CARE_PROVIDER_SITE_OTHER): Payer: Medicaid Other | Admitting: Advanced Practice Midwife

## 2020-01-25 ENCOUNTER — Other Ambulatory Visit: Payer: Self-pay

## 2020-01-25 ENCOUNTER — Other Ambulatory Visit: Payer: Medicaid Other

## 2020-01-25 VITALS — BP 134/83 | HR 104 | Wt 186.0 lb

## 2020-01-25 DIAGNOSIS — Z331 Pregnant state, incidental: Secondary | ICD-10-CM

## 2020-01-25 DIAGNOSIS — Z23 Encounter for immunization: Secondary | ICD-10-CM | POA: Diagnosis not present

## 2020-01-25 DIAGNOSIS — R000 Tachycardia, unspecified: Secondary | ICD-10-CM

## 2020-01-25 DIAGNOSIS — Z3A29 29 weeks gestation of pregnancy: Secondary | ICD-10-CM

## 2020-01-25 DIAGNOSIS — Z131 Encounter for screening for diabetes mellitus: Secondary | ICD-10-CM

## 2020-01-25 DIAGNOSIS — Z3403 Encounter for supervision of normal first pregnancy, third trimester: Secondary | ICD-10-CM

## 2020-01-25 DIAGNOSIS — Z1389 Encounter for screening for other disorder: Secondary | ICD-10-CM

## 2020-01-25 LAB — POCT URINALYSIS DIPSTICK OB
Blood, UA: NEGATIVE
Glucose, UA: NEGATIVE
Ketones, UA: NEGATIVE
Leukocytes, UA: NEGATIVE
Nitrite, UA: NEGATIVE
POC,PROTEIN,UA: NEGATIVE

## 2020-01-25 NOTE — Patient Instructions (Signed)
Third Trimester of Pregnancy  The third trimester of pregnancy is from week 28 through week 40. This is also called months 7 through 9. This trimester is when your unborn baby (fetus) is growing very fast. At the end of the ninth month, the unborn baby is about 20 inches long. It weighs about 6-10 pounds. Body changes during your third trimester Your body continues to go through many changes during this time. The changes vary and generally return to normal after the baby is born. Physical changes  Your weight will continue to increase. You may gain 25-35 pounds (11-16 kg) by the end of the pregnancy. If you are underweight, you may gain 28-40 lb (about 13-18 kg). If you are overweight, you may gain 15-25 lb (about 7-11 kg).  You may start to get stretch marks on your hips, belly (abdomen), and breasts.  Your breasts will continue to grow and may hurt. A yellow fluid (colostrum) may leak from your breasts. This is the first milk you are making for your baby.  You may have changes in your hair.  Your belly button may stick out.  You may have more swelling in your hands, face, or ankles. Health changes  You may have heartburn.  You may have trouble pooping (constipation).  You may get hemorrhoids. These are swollen veins in the butt that can itch or get painful.  You may have swollen veins (varicose veins) in your legs.  You may have more body aches in the pelvis, back, or thighs.  You may have more tingling or numbness in your hands, arms, and legs. The skin on your belly may also feel numb.  You may feel short of breath as your womb (uterus) gets bigger. Other changes  You may pee (urinate) more often.  You may have more problems sleeping.  You may notice the unborn baby "dropping," or moving lower in your belly.  You may have more discharge coming from your vagina.  Your joints may feel loose, and you may have pain around your pelvic bone. Follow these instructions at  home: Medicines  Take over-the-counter and prescription medicines only as told by your doctor. Some medicines are not safe during pregnancy.  Take a prenatal vitamin that contains at least 600 micrograms (mcg) of folic acid. Eating and drinking  Eat healthy meals that include: ? Fresh fruits and vegetables. ? Whole grains. ? Good sources of protein, such as meat, eggs, or tofu. ? Low-fat dairy products.  Avoid raw meat and unpasteurized juice, milk, and cheese. These carry germs that can harm you and your baby.  Eat 4 or 5 small meals rather than 3 large meals a day.  You may need to take these actions to prevent or treat trouble pooping: ? Drink enough fluids to keep your pee (urine) pale yellow. ? Eat foods that are high in fiber. These include beans, whole grains, and fresh fruits and vegetables. ? Limit foods that are high in fat and sugar. These include fried or sweet foods. Activity  Exercise only as told by your doctor. Stop exercising if you start to have cramps in your womb.  Avoid heavy lifting.  Do not exercise if it is too hot or too humid, or if you are in a place of great height (high altitude).  If you choose to, you may have sex unless your doctor tells you not to. Relieving pain and discomfort  Take breaks often, and rest with your legs raised (elevated) if you have   leg cramps or low back pain.  Take warm water baths (sitz baths) to soothe pain or discomfort caused by hemorrhoids. Use hemorrhoid cream if your doctor approves.  Wear a good support bra if your breasts are tender.  If you develop bulging, swollen veins in your legs: ? Wear support hose as told by your doctor. ? Raise your feet for 15 minutes, 3-4 times a day. ? Limit salt in your food. Safety  Talk to your doctor before traveling far distances.  Do not use hot tubs, steam rooms, or saunas.  Wear your seat belt at all times when you are in a car.  Talk with your doctor if someone is  hurting you or yelling at you a lot. Preparing for your baby's arrival To prepare for the arrival of your baby:  Take prenatal classes.  Visit the hospital and tour the maternity area.  Buy a rear-facing car seat. Learn how to install it in your car.  Prepare the baby's room. Take out all pillows and stuffed animals from the baby's crib. General instructions  Avoid cat litter boxes and soil used by cats. These carry germs that can cause harm to the baby and can cause a loss of your baby by miscarriage or stillbirth.  Do not douche or use tampons. Do not use scented sanitary pads.  Do not smoke or use any products that contain nicotine or tobacco. If you need help quitting, ask your doctor.  Do not drink alcohol.  Do not use herbal medicines, illegal drugs, or medicines that were not approved by your doctor. Chemicals in these products can affect your baby.  Keep all follow-up visits. This is important. Where to find more information  American Pregnancy Association: americanpregnancy.org  American College of Obstetricians and Gynecologists: www.acog.org  Office on Women's Health: womenshealth.gov/pregnancy Contact a doctor if:  You have a fever.  You have mild cramps or pressure in your lower belly.  You have a nagging pain in your belly area.  You vomit, or you have watery poop (diarrhea).  You have bad-smelling fluid coming from your vagina.  You have pain when you pee, or your pee smells bad.  You have a headache that does not go away when you take medicine.  You have changes in how you see, or you see spots in front of your eyes. Get help right away if:  Your water breaks.  You have regular contractions that are less than 5 minutes apart.  You are spotting or bleeding from your vagina.  You have very bad belly cramps or pain.  You have trouble breathing.  You have chest pain.  You faint.  You have not felt the baby move for the amount of time told by  your doctor.  You have new or increased pain, swelling, or redness in an arm or leg. Summary  The third trimester is from week 28 through week 40 (months 7 through 9). This is the time when your unborn baby is growing very fast.  During this time, your discomfort may increase as you gain weight and as your baby grows.  Get ready for your baby to arrive by taking prenatal classes, buying a rear-facing car seat, and preparing the baby's room.  Get help right away if you are bleeding from your vagina, you have chest pain and trouble breathing, or you have not felt the baby move for the amount of time told by your doctor. This information is not intended to replace advice given   to you by your health care provider. Make sure you discuss any questions you have with your health care provider. Document Revised: 06/08/2019 Document Reviewed: 04/14/2019 Elsevier Patient Education  [place].   Fetal Movement Counts Patient Name: ________________________________________________ Patient Due Date: ____________________  What is a fetal movement count? A fetal movement count is the number of times that you feel your baby move during a certain amount of time. This may also be called a fetal kick count. A fetal movement count is recommended for every pregnant woman. You may be asked to start counting fetal movements as early as week 28 of your pregnancy. Pay attention to when your baby is most active. You may notice your baby's sleep and wake cycles. You may also notice things that make your baby move more. You should do a fetal movement count:  When your baby is normally most active.  At the same time each day. A good time to count movements is while you are resting, after having something to eat and drink. How do I count fetal movements? 1. Find a quiet, comfortable area. Sit, or lie down on your side. 2. Write down the date, the start time and stop time, and the number of movements that you  felt between those two times. Take this information with you to your health care visits. 3. Write down your start time when you feel the first movement. 4. Count kicks, flutters, swishes, rolls, and jabs. You should feel at least 10 movements. 5. You may stop counting after you have felt 10 movements, or if you have been counting for 2 hours. Write down the stop time. 6. If you do not feel 10 movements in 2 hours, contact your health care provider for further instructions. Your health care provider may want to do additional tests to assess your baby's well-being. Contact a health care provider if:  You feel fewer than 10 movements in 2 hours.  Your baby is not moving like he or she usually does. Date: ____________ Start time: ____________ Stop time: ____________ Movements: ____________ Date: ____________ Start time: ____________ Stop time: ____________ Movements: ____________ Date: ____________ Start time: ____________ Stop time: ____________ Movements: ____________ Date: ____________ Start time: ____________ Stop time: ____________ Movements: ____________ Date: ____________ Start time: ____________ Stop time: ____________ Movements: ____________ Date: ____________ Start time: ____________ Stop time: ____________ Movements: ____________ Date: ____________ Start time: ____________ Stop time: ____________ Movements: ____________ Date: ____________ Start time: ____________ Stop time: ____________ Movements: ____________ Date: ____________ Start time: ____________ Stop time: ____________ Movements: ____________ This information is not intended to replace advice given to you by your health care provider. Make sure you discuss any questions you have with your health care provider. Document Revised: 08/19/2018 Document Reviewed: 08/19/2018 Elsevier Patient Education  [place].

## 2020-01-25 NOTE — Progress Notes (Signed)
   PRENATAL VISIT NOTE  Subjective:  Samantha Patterson is a 20 y.o. G1P0 at 55w1dbeing seen today for ongoing prenatal care.  She is currently monitored for the following issues for this low-risk pregnancy and has Anemia; Allergic rhinitis; Anxiety; Episodic tension-type headache, not intractable; Gastroesophageal reflux disease without esophagitis; Chronic gastritis without bleeding; Migraine without aura and without status migrainosus, not intractable; Insomnia; Multiple nevi; Supervision of normal first pregnancy; Rh negative state in antepartum period; and Chlamydia on their problem list.  Patient reports recurrent tachycardia. Recurrent problem, onset earlier in pregnancy. She endorses accompanying SOB. Episodes not consistently associated with increased physical activity. Denies chest pain, palpitations, Endorses consuming large amounts of water all day every day. Limits caffeine, healthy diet with regular meals. Typically snacks on cereal, chicken is preferred protein source. Contractions: Not present. Vag. Bleeding: None.  Movement: Present. Denies leaking of fluid.   The following portions of the patient's history were reviewed and updated as appropriate: allergies, current medications, past family history, past medical history, past social history, past surgical history and problem list. Problem list updated.  Objective:   Vitals:   01/25/20 0855  BP: 134/83  Pulse: (!) 104  Weight: 186 lb (84.4 kg)    Fetal Status: Fetal Heart Rate (bpm): 140 Fundal Height: 27 cm Movement: Present     General:  Alert, oriented and cooperative. Patient is in no acute distress.  Skin: Skin is warm and dry. No rash noted.   Cardiovascular: Normal heart rate noted  Respiratory: Normal respiratory effort, no problems with respiration noted  Abdomen: Soft, gravid, appropriate for gestational age.  Pain/Pressure: Absent     Pelvic: Cervical exam deferred        Extremities: Normal range of motion.  Edema:  None  Mental Status: Normal mood and affect. Normal behavior. Normal judgment and thought content.   Assessment and Plan:  Pregnancy: G1P0 at 265w1d1. Encounter for supervision of normal first pregnancy in third trimester - LOB, routine care - Daily kick counts beginning now, interventions for low kick number - Reviewed typical labor triage, steps to admission including [facility], L&D  2. Tachycardia - Advised morning and afternoon snacks, clean protein to maintain metabolism - Discussed physiology of hemodilution, expectations for activity tolerance - Comp Met (CMET)  3. Screening for genitourinary condition  - POC Urinalysis Dipstick OB  4. [redacted] weeks gestation of pregnancy  - Tdap vaccine greater than or equal to 7yo IM  Patient's partner will be joining [employer] shortly after delivery. Two-week PP mood check advised  Preterm labor symptoms and general obstetric precautions including but not limited to vaginal bleeding, contractions, leaking of fluid and fetal movement were reviewed in detail with the patient. Please refer to After Visit Summary for other counseling recommendations.  Return in about 2 weeks (around 02/08/2020) for CNM please.  Future Appointments  Date Time Provider [place]1/26/2022  9:30 AM DaLaury DeepCNM [facility] [place]

## 2020-01-26 LAB — HIV ANTIBODY (ROUTINE TESTING W REFLEX): HIV Screen 4th Generation wRfx: NONREACTIVE

## 2020-01-26 LAB — COMPREHENSIVE METABOLIC PANEL
ALT: 8 IU/L (ref 0–32)
AST: 14 IU/L (ref 0–40)
Albumin/Globulin Ratio: 1.7 (ref 1.2–2.2)
Albumin: 3.9 g/dL (ref 3.9–5.0)
Alkaline Phosphatase: 108 IU/L — ABNORMAL HIGH (ref 42–106)
BUN/Creatinine Ratio: 11 (ref 9–23)
BUN: 7 mg/dL (ref 6–20)
Bilirubin Total: 0.3 mg/dL (ref 0.0–1.2)
CO2: 19 mmol/L — ABNORMAL LOW (ref 20–29)
Calcium: 8.5 mg/dL — ABNORMAL LOW (ref 8.7–10.2)
Chloride: 101 mmol/L (ref 96–106)
Creatinine, Ser: 0.61 mg/dL (ref 0.57–1.00)
GFR calc Af Amer: 151 mL/min/{1.73_m2} (ref >59)
GFR calc non Af Amer: 131 mL/min/{1.73_m2} (ref >59)
Globulin, Total: 2.3 g/dL (ref 1.5–4.5)
Glucose: 142 mg/dL — ABNORMAL HIGH (ref 65–99)
Potassium: 3.9 mmol/L (ref 3.5–5.2)
Sodium: 136 mmol/L (ref 134–144)
Total Protein: 6.2 g/dL (ref 6.0–8.5)

## 2020-01-26 LAB — CBC
Hematocrit: 35.7 % (ref 34.0–46.6)
Hemoglobin: 11.7 g/dL (ref 11.1–15.9)
MCH: 30.5 pg (ref 26.6–33.0)
MCHC: 32.8 g/dL (ref 31.5–35.7)
MCV: 93 fL (ref 79–97)
Platelets: 165 10*3/uL (ref 150–450)
RBC: 3.83 x10E6/uL (ref 3.77–5.28)
RDW: 11.2 % — ABNORMAL LOW (ref 11.7–15.4)
WBC: 12.3 10*3/uL — ABNORMAL HIGH (ref 3.4–10.8)

## 2020-01-26 LAB — ANTIBODY SCREEN: Antibody Screen: NEGATIVE

## 2020-01-26 LAB — GLUCOSE TOLERANCE, 2 HOURS W/ 1HR
Glucose, 1 hour: 145 mg/dL (ref 65–179)
Glucose, 2 hour: 91 mg/dL (ref 65–152)
Glucose, Fasting: 83 mg/dL (ref 65–91)

## 2020-01-26 LAB — RPR: RPR Ser Ql: NONREACTIVE

## 2020-02-07 ENCOUNTER — Encounter: Payer: Self-pay | Admitting: *Deleted

## 2020-02-08 ENCOUNTER — Encounter: Payer: Self-pay | Admitting: Obstetrics and Gynecology

## 2020-02-08 ENCOUNTER — Ambulatory Visit (INDEPENDENT_AMBULATORY_CARE_PROVIDER_SITE_OTHER): Payer: Medicaid Other | Admitting: Obstetrics and Gynecology

## 2020-02-08 ENCOUNTER — Other Ambulatory Visit: Payer: Self-pay

## 2020-02-08 VITALS — BP 135/85 | HR 98 | Wt 186.0 lb

## 2020-02-08 DIAGNOSIS — O26893 Other specified pregnancy related conditions, third trimester: Secondary | ICD-10-CM

## 2020-02-08 DIAGNOSIS — Z6791 Unspecified blood type, Rh negative: Secondary | ICD-10-CM | POA: Diagnosis not present

## 2020-02-08 DIAGNOSIS — Z3A31 31 weeks gestation of pregnancy: Secondary | ICD-10-CM | POA: Diagnosis not present

## 2020-02-08 DIAGNOSIS — Z3403 Encounter for supervision of normal first pregnancy, third trimester: Secondary | ICD-10-CM

## 2020-02-08 NOTE — Progress Notes (Signed)
LOW-RISK PREGNANCY OFFICE VISIT Patient name: Samantha Patterson MRN [id number]  Date of birth: [birth year] Chief Complaint:   Routine Prenatal Visit  History of Present Illness:   Samantha Patterson is a 20 y.o. G1P0 female at [redacted]w[redacted]d with an Estimated Date of Delivery: 04/10/20 being seen today for ongoing management of a low-risk pregnancy.  Today she reports no complaints. She and her husband express concern for baby's weight. The father states he was 9 lbs 13 oz at birth, so they are anxious to know what her estimated weight is going to be. They are requesting a growth U/S at their next visit. Contractions: Not present. Vag. Bleeding: None.  Movement: Present. denies leaking of fluid. Review of Systems:   Pertinent items are noted in HPI Denies abnormal vaginal discharge w/ itching/odor/irritation, headaches, visual changes, shortness of breath, chest pain, abdominal pain, severe nausea/vomiting, or problems with urination or bowel movements unless otherwise stated above. Pertinent History Reviewed:  Reviewed past medical,surgical, social, obstetrical and family history.  Reviewed problem list, medications and allergies. Physical Assessment:   Vitals:   02/08/20 0927  BP: 135/85  Pulse: 98  Weight: 186 lb (84.4 kg)  Body mass index is 28.28 kg/m.        Physical Examination:   General appearance: Well appearing, and in no distress  Mental status: Alert, oriented to person, place, and time  Skin: Warm & dry  Cardiovascular: Normal heart rate noted  Respiratory: Normal respiratory effort, no distress  Abdomen: Soft, gravid, nontender  Pelvic: Cervical exam deferred         Extremities: Edema: None  Fetal Status: Fetal Heart Rate (bpm): 145 Fundal Height: 32 cm Movement: Present Presentation: Undeterminable  No results found for this or any previous visit (from the past 24 hour(s)).  Assessment & Plan:  1) Low-risk pregnancy G1P0 at [redacted]w[redacted]d with an Estimated Date of Delivery: 04/10/20   2)  Encounter for supervision of normal first pregnancy in third trimester - Discussed normal growth determined by normal FH, but will order growth U/S d/t patient's concern - - Anticipatory guidance for GBS screening at 36 wks. Explained the test is important to be done at this time in pregnancy to ensure adequate treatment at the time of delivery. Explained that a positive result does not mean any harm to her, but can be harmful to the baby. Meaning that if baby is exposed to the bacteria for too long without antibiotics, the bay has the potential to develop pneumonia, septicemia, or spinal meningitis and could end up in the NICU. Also, explained that a cervical exam may be performed at the time of testing to get a baseline cervical check and make sure there is no preterm cervical dilation. - Information provided on GBS testing   3) Rh negative status during pregnancy in third trimester - Rhogam received  4) [redacted] weeks gestation of pregnancy    Meds: No orders of the defined types were placed in this encounter.  Labs/procedures today: none  Plan:  Continue routine obstetrical care   Reviewed: Preterm labor symptoms and general obstetric precautions including but not limited to vaginal bleeding, contractions, leaking of fluid and fetal movement were reviewed in detail with the patient.  All questions were answered. Has home bp cuff.  Check bp weekly, let us know if >140/90.   Follow-up: Return in about 4 weeks (around 03/07/2020) for Return OB w/GBS.  Orders Placed This Encounter  Procedures  . RHO (D) Immune Globulin   Beryl Hornberger Renato Battles  MSN, CNM 02/08/2020 9:54 AM

## 2020-02-08 NOTE — Patient Instructions (Signed)

## 2020-02-26 ENCOUNTER — Encounter (HOSPITAL_COMMUNITY): Payer: Self-pay | Admitting: Family Medicine

## 2020-02-26 ENCOUNTER — Inpatient Hospital Stay (HOSPITAL_COMMUNITY)
Admission: AD | Admit: 2020-02-26 | Discharge: 2020-02-26 | Disposition: A | Discharge status: Home / Self Care | Payer: Medicaid Other | Attending: Family Medicine | Admitting: Family Medicine

## 2020-02-26 ENCOUNTER — Other Ambulatory Visit: Payer: Self-pay

## 2020-02-26 DIAGNOSIS — O26893 Other specified pregnancy related conditions, third trimester: Secondary | ICD-10-CM | POA: Insufficient documentation

## 2020-02-26 DIAGNOSIS — O99891 Other specified diseases and conditions complicating pregnancy: Secondary | ICD-10-CM | POA: Diagnosis not present

## 2020-02-26 DIAGNOSIS — R000 Tachycardia, unspecified: Secondary | ICD-10-CM

## 2020-02-26 DIAGNOSIS — R030 Elevated blood-pressure reading, without diagnosis of hypertension: Secondary | ICD-10-CM | POA: Diagnosis not present

## 2020-02-26 DIAGNOSIS — Z3A33 33 weeks gestation of pregnancy: Secondary | ICD-10-CM

## 2020-02-26 DIAGNOSIS — O368130 Decreased fetal movements, third trimester, not applicable or unspecified: Secondary | ICD-10-CM | POA: Diagnosis not present

## 2020-02-26 DIAGNOSIS — Z3403 Encounter for supervision of normal first pregnancy, third trimester: Secondary | ICD-10-CM

## 2020-02-26 LAB — COMPREHENSIVE METABOLIC PANEL
ALT: 11 U/L (ref 0–44)
AST: 17 U/L (ref 15–41)
Albumin: 2.8 g/dL — ABNORMAL LOW (ref 3.5–5.0)
Alkaline Phosphatase: 110 U/L (ref 38–126)
Anion gap: 8 (ref 5–15)
BUN: 9 mg/dL (ref 6–20)
CO2: 22 mmol/L (ref 22–32)
Calcium: 8.7 mg/dL — ABNORMAL LOW (ref 8.9–10.3)
Chloride: 107 mmol/L (ref 98–111)
Creatinine, Ser: 0.54 mg/dL (ref 0.44–1.00)
GFR, Estimated: >60 mL/min (ref >60)
Glucose, Bld: 96 mg/dL (ref 70–99)
Potassium: 3.9 mmol/L (ref 3.5–5.1)
Sodium: 137 mmol/L (ref 135–145)
Total Bilirubin: 0.4 mg/dL (ref 0.3–1.2)
Total Protein: 6.2 g/dL — ABNORMAL LOW (ref 6.5–8.1)

## 2020-02-26 LAB — URINALYSIS, ROUTINE W REFLEX MICROSCOPIC
Bilirubin Urine: NEGATIVE
Glucose, UA: NEGATIVE mg/dL
Hgb urine dipstick: NEGATIVE
Ketones, ur: NEGATIVE mg/dL
Leukocytes,Ua: NEGATIVE
Nitrite: NEGATIVE
Protein, ur: NEGATIVE mg/dL
Specific Gravity, Urine: 1.017 (ref 1.005–1.030)
pH: 6 (ref 5.0–8.0)

## 2020-02-26 LAB — CBC
HCT: 32.9 % — ABNORMAL LOW (ref 36.0–46.0)
Hemoglobin: 11.3 g/dL — ABNORMAL LOW (ref 12.0–15.0)
MCH: 30.6 pg (ref 26.0–34.0)
MCHC: 34.3 g/dL (ref 30.0–36.0)
MCV: 89.2 fL (ref 80.0–100.0)
Platelets: 189 10*3/uL (ref 150–400)
RBC: 3.69 MIL/uL — ABNORMAL LOW (ref 3.87–5.11)
RDW: 12.7 % (ref 11.5–15.5)
WBC: 12.7 10*3/uL — ABNORMAL HIGH (ref 4.0–10.5)
nRBC: 0 % (ref 0.0–0.2)

## 2020-02-26 LAB — PROTEIN / CREATININE RATIO, URINE
Creatinine, Urine: 106.7 mg/dL
Protein Creatinine Ratio: 0.07 mg/mg{Cre} (ref 0.00–0.15)
Total Protein, Urine: 7 mg/dL

## 2020-02-26 NOTE — MAU Provider Note (Signed)
History     CSN: [id number]  Arrival date and time: 02/26/20 1403  Event Date/Time  First Provider Initiated Contact with Patient 02/26/20 1504     Chief Complaint  Patient presents with  . Hypertension   HPI Samantha Patterson is a 20 y.o. G1P0 at [redacted]w[redacted]d who presents to MAU with chief complaint of elevated blood pressure on her home cuff. Within the past 24 hours she has received readings of 132/96 and 139/106 on two different home cuffs. She denies headache, visual disturbances, RUQ/epigastric pain, new onset swelling or weight gain.  Maternal Tachycardia This is a recurrent problem, onset in second trimester. Patient states she was standing, getting ready and doing her hair when she "felt her heart racing". She denies chest pain, weakness, syncope. Symptoms improved when she adopted a seated position.  Decreased Fetal Movement This was listed as a complaint to MAU registration. Patient denies DFM on initial exam and endorsed ability to detect fetal movement throughout time in MAU.  Patient receives care with [facility].  OB History    Gravida  1   Para      Term      Preterm      AB      Living        SAB      IAB      Ectopic      Multiple      Live Births              Past Medical History:  Diagnosis Date  . Medical history non-contributory     Past Surgical History:  Procedure Laterality Date  . NO PAST SURGERIES      Family History  Problem Relation Age of Onset  . Migraines Mother   . Migraines Father   . Migraines Sister   . Heart attack Maternal Grandfather     Social History   Tobacco Use  . Smoking status: Never Smoker  . Smokeless tobacco: Never Used  . Tobacco comment: mom and dad smokes outside  Vaping Use  . Vaping Use: Never used  Substance Use Topics  . Alcohol use: Never    Alcohol/week: 0.0 standard drinks  . Drug use: Never    Allergies: No Known Allergies  Medications Prior to Admission  Medication Sig  Dispense Refill Last Dose  . Doxylamine-Pyridoxine (DICLEGIS) 10-10 MG TBEC 2 tabs q hs, if sx persist add 1 tab q am on day 3, if sx persist add 1 tab q afternoon on day 4 (Patient not taking: No sig reported) 100 tablet 6   . Pediatric Multivitamins-Iron (FLINTSTONES COMPLETE) 18 MG CHEW Chew 2 daily       Review of Systems  Eyes: Negative for visual disturbance.  Respiratory: Negative for shortness of breath.   Cardiovascular: Positive for palpitations.       Not present during MAU evaluation but felt while getting dressed earlier today.  Gastrointestinal: Negative for abdominal pain.  Genitourinary: Negative for vaginal bleeding.  Musculoskeletal: Negative for back pain.  Neurological: Negative for headaches.  All other systems reviewed and are negative.  Physical Exam   Blood pressure 127/73, pulse 91, temperature 99.2 F (37.3 C), temperature source Oral, resp. rate 16, height 5\' 8"  (1.727 m), weight 87.8 kg, last menstrual period 07/05/2019, SpO2 99 %.  Physical Exam Vitals and nursing note reviewed. Exam conducted with a chaperone present.  Constitutional:      Appearance: Normal appearance. She is not ill-appearing.  Cardiovascular:  Rate and Rhythm: Normal rate.     Pulses: Normal pulses.     Heart sounds: Normal heart sounds.  Pulmonary:     Effort: Pulmonary effort is normal.     Breath sounds: Normal breath sounds.  Abdominal:     Comments: Gravid  Skin:    Capillary Refill: Capillary refill takes less than 2 seconds.  Neurological:     Mental Status: She is alert and oriented to person, place, and time.  Psychiatric:        Mood and Affect: Mood normal.        Behavior: Behavior normal.        Thought Content: Thought content normal.        Judgment: Judgment normal.     MAU Course  Procedures  --Records reviewed. No history of elevated blood pressures in office. Asymptomatic --Normal EKG in MAU --Normotensive throughout MAU evaluation --PEC labs  WNL --Discussed expectations for activity tolerance in third trimester, modifications to allow improved stamina.  Patient Vitals for the past 24 hrs:  BP Temp Temp src Pulse Resp SpO2 Height Weight  02/26/20 1600 117/79 -- -- 93 -- 100 % -- --  02/26/20 1545 120/86 -- -- (!) 102 -- 99 % -- --  02/26/20 1530 125/82 -- -- 93 -- 99 % -- --  02/26/20 1515 127/73 -- -- 91 -- 99 % -- --  02/26/20 1457 138/86 -- -- 98 -- -- -- --  02/26/20 1442 127/76 99.2 F (37.3 C) Oral 100 16 99 % -- --  02/26/20 1435 -- -- -- -- -- -- 5\' 8"  (1.727 m) 87.8 kg   Results for orders placed or performed during the hospital encounter of 02/26/20 (from the past 24 hour(s))  Protein / creatinine ratio, urine     Status: None   Collection Time: 02/26/20  2:41 PM  Result Value Ref Range   Creatinine, Urine 106.70 mg/dL   Total Protein, Urine 7 mg/dL   Protein Creatinine Ratio 0.07 0.00 - 0.15 mg/mg[Cre]  Urinalysis, Routine w reflex microscopic     Status: None   Collection Time: 02/26/20  2:41 PM  Result Value Ref Range   Color, Urine YELLOW YELLOW   APPearance CLEAR CLEAR   Specific Gravity, Urine 1.017 1.005 - 1.030   pH 6.0 5.0 - 8.0   Glucose, UA NEGATIVE NEGATIVE mg/dL   Hgb urine dipstick NEGATIVE NEGATIVE   Bilirubin Urine NEGATIVE NEGATIVE   Ketones, ur NEGATIVE NEGATIVE mg/dL   Protein, ur NEGATIVE NEGATIVE mg/dL   Nitrite NEGATIVE NEGATIVE   Leukocytes,Ua NEGATIVE NEGATIVE  CBC     Status: Abnormal   Collection Time: 02/26/20  3:17 PM  Result Value Ref Range   WBC 12.7 (H) 4.0 - 10.5 K/uL   RBC 3.69 (L) 3.87 - 5.11 MIL/uL   Hemoglobin 11.3 (L) 12.0 - 15.0 g/dL   HCT 32.9 (L) 36.0 - 46.0 %   MCV 89.2 80.0 - 100.0 fL   MCH 30.6 26.0 - 34.0 pg   MCHC 34.3 30.0 - 36.0 g/dL   RDW 12.7 11.5 - 15.5 %   Platelets 189 150 - 400 K/uL   nRBC 0.0 0.0 - 0.2 %  Comprehensive metabolic panel     Status: Abnormal   Collection Time: 02/26/20  3:17 PM  Result Value Ref Range   Sodium 137 135 - 145  mmol/L   Potassium 3.9 3.5 - 5.1 mmol/L   Chloride 107 98 - 111 mmol/L   CO2 22  22 - 32 mmol/L   Glucose, Bld 96 70 - 99 mg/dL   BUN 9 6 - 20 mg/dL   Creatinine, Ser 0.54 0.44 - 1.00 mg/dL   Calcium 8.7 (L) 8.9 - 10.3 mg/dL   Total Protein 6.2 (L) 6.5 - 8.1 g/dL   Albumin 2.8 (L) 3.5 - 5.0 g/dL   AST 17 15 - 41 U/L   ALT 11 0 - 44 U/L   Alkaline Phosphatase 110 38 - 126 U/L   Total Bilirubin 0.4 0.3 - 1.2 mg/dL   GFR, Estimated >60 >60 mL/min   Anion gap 8 5 - 15   Assessment and Plan  --20 y.o. G1P0 at [redacted]w[redacted]d  --Reactive tracing --Normotensive, PEC labs WNL --Discharge home in stable condition  F/U: --Next appointment with [facility] is 03/07/2020  Darlina Rumpf, CNM 02/26/2020, 6:10 PM

## 2020-02-26 NOTE — MAU Note (Signed)
Samantha Patterson is a 20 y.o. at [redacted]w[redacted]d here in MAU reporting: yesterday after she did her morning routine and she was doing her hair and she felt weak due to elevated HR. She has checked her BP a couple of times since yesterday and it has been mildly elevated. Had a headache earlier today but none currently. No visual changes, no RUQ pain. DFM  Onset of complaint: yesterday  Pain score: 0/10  Vitals:   02/26/20 1442  BP: 127/76  Pulse: 100  Resp: 16  Temp: 99.2 F (37.3 C)  SpO2: 99%     FHT:163  Lab orders placed from triage: UA

## 2020-02-26 NOTE — Discharge Instructions (Signed)
Vaginal Bleeding During Pregnancy, Third Trimester A small amount of bleeding, or spotting, from the vagina is common during early pregnancy. Sometimes the bleeding is normal and is not a problem, and sometimes it is a sign of something serious. In the third trimester, normal bleeding can happen:  Because of changes in your blood vessels.  When you have sex.  When you have pelvic exams. During this time, some abnormal things can cause bleeding. These include:  Infection in the womb.  Growths in the lowest part of the womb (cervix). These growths are also called polyps.  Problems of the placenta. The placenta may: ? Block the opening of the cervix. ? Break away from the womb. ? Grow into the muscle of the womb.  Early labor. Tell your doctor right away about any bleeding from your vagina. Follow these instructions at home: Watch your bleeding  Watch your condition for any changes. Let your doctor know if you are worried about something.  Try to know what causes your bleeding. Ask yourself these questions: ? Does the bleeding start on its own? ? Does the bleeding start after something is done, such as sex or a pelvic exam?  Use a diary to write the things you see about your bleeding. Write in your diary: ? If the bleeding flows freely without stopping, or if it starts and stops, and then starts again. ? If the bleeding is heavy or light. ? How many pads you use in a day and how much blood is in them.  Tell your doctor if you pass tissue. He or she may want to see it.   Activity  Follow your doctor's instructions about how active you can be. Your doctor may recommend that you: ? Stay in bed and only get up to use the bathroom. ? Continue light activity.  Ask your doctor if it is safe for you to drive.  Do not lift anything that is heavier than 10 lb (4.5 kg), or the limit that you are told.  Do not have sex until your doctor says that this is safe.  If needed, make plans  for someone to help you with your normal activities. Medicines  Take over-the-counter and prescription medicines only as told by your doctor.  Do not take aspirin. It can cause bleeding. General instructions  Do not use tampons.  Do not douche.  Keep all follow-up visits. Contact a doctor if:  You have vaginal bleeding at any time during pregnancy.  You have cramps.  You have a fever. Get help right away if:  You have very bad cramps.  You have very bad pain in your back or belly (abdomen).  You have a gush of fluid from your vagina.  Your bleeding gets worse.  You pass large clots or a lot of tissue from your vagina.  You feel light-headed or weak.  You pass out (faint).  Your baby is moving less than usual, or not moving at all. Summary  A small amount of bleeding is normal during pregnancy. Some bleeding may be caused by more serious problems.  Tell your doctor right away about any bleeding from your vagina.  Follow instructions from your doctor about how active you can be. You may need someone to help you with your normal activities. This information is not intended to replace advice given to you by your health care provider. Make sure you discuss any questions you have with your health care provider. Document Revised: 09/22/2019 Document Reviewed: 09/22/2019  Elsevier Patient Education  2021 [employer].

## 2020-03-06 ENCOUNTER — Other Ambulatory Visit: Payer: Self-pay | Admitting: Advanced Practice Midwife

## 2020-03-06 DIAGNOSIS — Z3689 Encounter for other specified antenatal screening: Secondary | ICD-10-CM

## 2020-03-07 ENCOUNTER — Ambulatory Visit (INDEPENDENT_AMBULATORY_CARE_PROVIDER_SITE_OTHER): Payer: Medicaid Other

## 2020-03-07 ENCOUNTER — Ambulatory Visit (INDEPENDENT_AMBULATORY_CARE_PROVIDER_SITE_OTHER): Payer: Medicaid Other | Admitting: Advanced Practice Midwife

## 2020-03-07 ENCOUNTER — Other Ambulatory Visit: Payer: Self-pay

## 2020-03-07 VITALS — BP 127/84 | HR 92 | Wt 197.0 lb

## 2020-03-07 DIAGNOSIS — Z3A35 35 weeks gestation of pregnancy: Secondary | ICD-10-CM

## 2020-03-07 DIAGNOSIS — Z3689 Encounter for other specified antenatal screening: Secondary | ICD-10-CM

## 2020-03-07 DIAGNOSIS — Z3403 Encounter for supervision of normal first pregnancy, third trimester: Secondary | ICD-10-CM

## 2020-03-07 MED ORDER — PANTOPRAZOLE SODIUM 20 MG PO TBEC: 20.0000 mg | DELAYED_RELEASE_TABLET | Freq: Every day | ORAL | 3 refills | Status: DC

## 2020-03-07 NOTE — Patient Instructions (Signed)

## 2020-03-07 NOTE — Progress Notes (Signed)
   LOW-RISK PREGNANCY VISIT Patient name: Samantha Patterson MRN [id number]  Date of birth: 23-Apr-2000 Chief Complaint:   Routine Prenatal Visit and Pregnancy Ultrasound  History of Present Illness:   Samantha Patterson is a 20 y.o. G1P0 female at [redacted]w[redacted]d with an Estimated Date of Delivery: 04/10/20 being seen today for ongoing management of a low-risk pregnancy.  Today she reports heartburn. Contractions: Not present. Vag. Bleeding: None.  Movement: Present. denies leaking of fluid. Review of Systems:   Pertinent items are noted in HPI Denies abnormal vaginal discharge w/ itching/odor/irritation, headaches, visual changes, shortness of breath, chest pain, abdominal pain, severe nausea/vomiting, or problems with urination or bowel movements unless otherwise stated above. Pertinent History Reviewed:  Reviewed past medical,surgical, social, obstetrical and family history.  Reviewed problem list, medications and allergies. Physical Assessment:   Vitals:   03/07/20 1518  BP: 127/84  Pulse: 92  Weight: 197 lb (89.4 kg)  Body mass index is 29.95 kg/m.        Physical Examination:   General appearance: Well appearing, and in no distress  Mental status: Alert, oriented to person, place, and time  Skin: Warm & dry  Cardiovascular: Normal heart rate noted  Respiratory: Normal respiratory effort, no distress  Abdomen: Soft, gravid, nontender  Pelvic: Cervical exam deferred         Extremities: Edema: Trace  Fetal Status: Fetal Heart Rate (bpm): 146 u/s   Movement: Present     Growth u/s: [place] 67+6 wks,cephalic,anterior placenta gr 2,afi 11.3 cm,FHR 146 bpm,EFW 2802 g 69%  No results found for this or any previous visit (from the past 24 hour(s)).  Assessment & Plan:  1) Low-risk pregnancy G1P0 at [redacted]w[redacted]d with an Estimated Date of Delivery: 04/10/20 ; appropriately grown baby  2) GERD, rx Protonix and then use Tums for breakthrough symptoms   Meds:  Meds ordered this encounter  Medications  .  pantoprazole (PROTONIX) 20 MG tablet    Sig: Take 1 tablet (20 mg total) by mouth daily.    Dispense:  30 tablet    Refill:  3    Order Specific Question:   Supervising Provider    Answer:   Florian Buff [[id number]]   Labs/procedures today: growth scan (FOB was >9lbs)  Plan:  Continue routine obstetrical care   Reviewed: Preterm labor symptoms and general obstetric precautions including but not limited to vaginal bleeding, contractions, leaking of fluid and fetal movement were reviewed in detail with the patient.  All questions were answered. Has home bp cuff. Check bp weekly, let us know if >140/90.   Follow-up: Return in about 2 weeks (around 03/21/2020) for LROB, in person; GBS/cultures.  No orders of the defined types were placed in this encounter.  Myrtis Ser [facility] 03/07/2020 3:39 PM

## 2020-03-07 NOTE — Progress Notes (Signed)
[place] 67+7 wks,cephalic,anterior placenta gr 2,afi 11.3 cm,FHR 146 bpm,EFW 2802 g 69%

## 2020-03-21 ENCOUNTER — Ambulatory Visit: Payer: Medicaid Other | Admitting: Obstetrics & Gynecology

## 2020-03-21 ENCOUNTER — Encounter: Payer: Self-pay | Admitting: Obstetrics & Gynecology

## 2020-03-21 ENCOUNTER — Other Ambulatory Visit (HOSPITAL_COMMUNITY)
Admission: RE | Admit: 2020-03-21 | Discharge: 2020-03-21 | Disposition: A | Discharge status: Home / Self Care | Payer: Medicaid Other | Source: Ambulatory Visit | Attending: Advanced Practice Midwife | Admitting: Advanced Practice Midwife

## 2020-03-21 ENCOUNTER — Other Ambulatory Visit: Payer: Self-pay

## 2020-03-21 VITALS — BP 125/86 | HR 96 | Wt 202.5 lb

## 2020-03-21 DIAGNOSIS — Z3403 Encounter for supervision of normal first pregnancy, third trimester: Secondary | ICD-10-CM | POA: Diagnosis present

## 2020-03-21 DIAGNOSIS — Z3A37 37 weeks gestation of pregnancy: Secondary | ICD-10-CM | POA: Insufficient documentation

## 2020-03-21 NOTE — Progress Notes (Signed)
   LOW-RISK PREGNANCY VISIT Patient name: Samantha Patterson MRN [id number]  Date of birth: 28-Jul-2000 Chief Complaint:   Routine Prenatal Visit (GBS, GC/CHL)  History of Present Illness:   Samantha Patterson is a 20 y.o. G1P0 female at [redacted]w[redacted]d with an Estimated Date of Delivery: 04/10/20 being seen today for ongoing management of a low-risk pregnancy.  Depression screen [facility] 2/9 01/25/2020 10/03/2019 11/23/2013  Decreased Interest 1 2 2   Down, Depressed, Hopeless 1 1 3   PHQ - 2 Score 2 3 5   Altered sleeping 1 2 3   Tired, decreased energy 1 2 2   Change in appetite 1 2 3   Feeling bad or failure about yourself  1 2 2   Trouble concentrating 1 2 2   Moving slowly or fidgety/restless 1 0 2  Suicidal thoughts 0 0 0  PHQ-9 Score 8 13 19     Today she reports occasional irregular contractions.  Vag. Bleeding: None.  Movement: Present. Denies leaking of fluid.  Overall doing well and reports no acute complaints  Review of Systems:   Pertinent items are noted in HPI Denies abnormal vaginal discharge w/ itching/odor/irritation, headaches, visual changes, shortness of breath, chest pain, abdominal pain, severe nausea/vomiting, or problems with urination or bowel movements unless otherwise stated above. Pertinent History Reviewed:  Reviewed past medical,surgical, social, obstetrical and family history.  Reviewed problem list, medications and allergies. Physical Assessment:   Vitals:   03/21/20 1606  BP: 125/86  Pulse: 96  Weight: 202 lb 8 oz (91.9 kg)  Body mass index is 30.79 kg/m.        Physical Examination:   General appearance: Well appearing, and in no distress  Mental status: mood appropriate  Skin: Warm & dry  Cardiovascular: Normal heart rate noted  Respiratory: Normal respiratory effort, no distress  Abdomen: Soft, gravid, nontender  Pelvic: Cervical exam performed  SVE: closed/soft, vertex    Extremities: Edema: Trace  Fetal Status: Fetal Heart Rate (bpm): 145 Fundal Height: 36 cm Movement:  Present Presentation: Vertex  Chaperone: Levy Pupa   No results found for this or any previous visit (from the past 24 hour(s)).  Assessment & Plan:  1) Low-risk pregnancy G1P0 at [redacted]w[redacted]d with an Estimated Date of Delivery: 04/10/20  Briefly discussed IOL @ 41wks if she does not go into labor on her own Reviewed LRP and fetal movment   Meds: No orders of the defined types were placed in this encounter.  Labs/procedures today: GBS  Plan:  Continue routine obstetrical care Next visit: prefers in person    Reviewed: Term labor symptoms and general obstetric precautions including but not limited to vaginal bleeding, contractions, leaking of fluid and fetal movement were reviewed in detail with the patient.  All questions were answered. Pt has home bp cuff. Check bp weekly, let us know if >140/90.   Follow-up: Return in about 1 week (around 03/28/2020) for [place] visit.  No future appointments.  Orders Placed This Encounter  Procedures  . Strep Gp B NAA   Janyth Pupa, DO Attending [place], [facility] for [employer], [place]

## 2020-03-23 LAB — CERVICOVAGINAL ANCILLARY ONLY
Chlamydia: NEGATIVE
Comment: NEGATIVE
Comment: NORMAL
Neisseria Gonorrhea: NEGATIVE

## 2020-03-24 LAB — STREP GP B NAA: Strep Gp B NAA: NEGATIVE

## 2020-03-29 ENCOUNTER — Ambulatory Visit (INDEPENDENT_AMBULATORY_CARE_PROVIDER_SITE_OTHER): Payer: Medicaid Other | Admitting: Women's Health

## 2020-03-29 ENCOUNTER — Other Ambulatory Visit: Payer: Self-pay

## 2020-03-29 ENCOUNTER — Encounter: Payer: Self-pay | Admitting: Women's Health

## 2020-03-29 VITALS — BP 135/87 | Wt 201.0 lb

## 2020-03-29 DIAGNOSIS — Z3403 Encounter for supervision of normal first pregnancy, third trimester: Secondary | ICD-10-CM

## 2020-03-29 LAB — POCT URINALYSIS DIPSTICK OB
Blood, UA: NEGATIVE
Glucose, UA: NEGATIVE
Ketones, UA: NEGATIVE
Leukocytes, UA: NEGATIVE
Nitrite, UA: NEGATIVE
POC,PROTEIN,UA: NEGATIVE

## 2020-03-29 NOTE — Progress Notes (Signed)
LOW-RISK PREGNANCY VISIT Patient name: Samantha Patterson MRN [id number]  Date of birth: 12-16-2000 Chief Complaint:   Routine Prenatal Visit  History of Present Illness:   Lajoy Vanamburg is a 20 y.o. G1P0 female at [redacted]w[redacted]d with an Estimated Date of Delivery: 04/10/20 being seen today for ongoing management of a low-risk pregnancy.  Depression screen [facility] 2/9 01/25/2020 10/03/2019 11/23/2013  Decreased Interest 1 2 2   Down, Depressed, Hopeless 1 1 3   PHQ - 2 Score 2 3 5   Altered sleeping 1 2 3   Tired, decreased energy 1 2 2   Change in appetite 1 2 3   Feeling bad or failure about yourself  1 2 2   Trouble concentrating 1 2 2   Moving slowly or fidgety/restless 1 0 2  Suicidal thoughts 0 0 0  PHQ-9 Score 8 13 19     Today she reports some mild headaches. No visual changes. Some mild RUQ pain.  Contractions: Irregular. Vag. Bleeding: None.  Movement: Present. denies leaking of fluid. Review of Systems:   Pertinent items are noted in HPI Denies abnormal vaginal discharge w/ itching/odor/irritation, headaches, visual changes, shortness of breath, chest pain, abdominal pain, severe nausea/vomiting, or problems with urination or bowel movements unless otherwise stated above. Pertinent History Reviewed:  Reviewed past medical,surgical, social, obstetrical and family history.  Reviewed problem list, medications and allergies. Physical Assessment:   Vitals:   03/29/20 1429  BP: 135/87  Weight: 201 lb (91.2 kg)  Body mass index is 30.56 kg/m.        Physical Examination:   General appearance: Well appearing, and in no distress  Mental status: Alert, oriented to person, place, and time  Skin: Warm & dry  Cardiovascular: Normal heart rate noted  Respiratory: Normal respiratory effort, no distress  Abdomen: Soft, gravid, nontender  Pelvic: Cervical exam performed by Jeanann Lewandowsky, SNP Dilation: 2 Effacement (%): 50 Station: -3  Extremities: Edema: Trace  Fetal Status: Fetal Heart Rate (bpm): 153  Fundal Height: 36 cm Movement: Present Presentation: Vertex  Chaperone: me   Results for orders placed or performed in visit on 03/29/20 (from the past 24 hour(s))  POC Urinalysis Dipstick OB   Collection Time: 03/29/20  3:12 PM  Result Value Ref Range   Color, UA     Clarity, UA     Glucose, UA Negative Negative   Bilirubin, UA     Ketones, UA neg    Spec Grav, UA     Blood, UA neg    pH, UA     POC,PROTEIN,UA Negative Negative, Trace, Small (1+), Moderate (2+), Large (3+), 4+   Urobilinogen, UA     Nitrite, UA neg    Leukocytes, UA Negative Negative   Appearance     Odor      Assessment & Plan:  1) Low-risk pregnancy G1P0 at [redacted]w[redacted]d with an Estimated Date of Delivery: 04/10/20   2) Borderline bp, some mild headaches/ruq pain, otherwise asymptomatic, no proteinuria. Check bp's BID, reviewed pre-e s/s, reasons to seek care. F/U early next week   Meds: No orders of the defined types were placed in this encounter.  Labs/procedures today: SVE  Plan:  Continue routine obstetrical care  Next visit: prefers in person    Reviewed: Term labor symptoms and general obstetric precautions including but not limited to vaginal bleeding, contractions, leaking of fluid and fetal movement were reviewed in detail with the patient.  All questions were answered. Has home bp cuff.  Check bp weekly, let us know if >140/90.  Follow-up: Return for Monday or tuesday for lrob w/ cnm in person.  Future Appointments  Date Time Provider [place]  04/03/2020  9:30 AM [name], CNM [facility]    Orders Placed This Encounter  Procedures  . POC Urinalysis Dipstick OB   [name] CNM, [facility] 03/29/2020 3:13 PM

## 2020-03-29 NOTE — Patient Instructions (Signed)
Samantha Patterson, I greatly value your feedback.  If you receive a survey following your visit with [place] today, we appreciate you taking the time to fill it out.  Thanks, [name], CNM, WHNP-BC  [facility] [place] at [facility] ([place], [place] [address]) Entrance C, located off of [address] parking   Go to [email or web address] to register for FREE online childbirth classes    Call the office [phone]) or go to [facility] if:  You begin to have strong, frequent contractions  Your water breaks.  Sometimes it is a big gush of fluid, sometimes it is just a trickle that keeps getting your panties wet or running down your legs  You have vaginal bleeding.  It is normal to have a small amount of spotting if your cervix was checked.   You don't feel your baby moving like normal.  If you don't, get you something to eat and drink and lay down and focus on feeling your baby move.  You should feel at least 10 movements in 2 hours.  If you don't, you should call the office or go to [facility].   Call the office [phone]) or go to [facility] hospital for these signs of pre-eclampsia:  Severe headache that does not go away with Tylenol  Visual changes- seeing spots, double, blurred vision  Pain under your right breast or upper abdomen that does not go away with Tums or heartburn medicine  Nausea and/or vomiting  Severe swelling in your hands, feet, and face    Home Blood Pressure Monitoring for Patients   Your provider has recommended that you check your blood pressure (BP) at least once a week at home. If you do not have a blood pressure cuff at home, one will be provided for you. Contact your provider if you have not received your monitor within 1 week.   Helpful Tips for Accurate Home Blood Pressure Checks  . Don't smoke, exercise, or drink caffeine 30 minutes before checking your BP . Use the restroom before checking your BP (a full bladder  can raise your pressure) . Relax in a comfortable upright chair . Feet on the ground . Left arm resting comfortably on a flat surface at the level of your heart . Legs uncrossed . Back supported . Sit quietly and don't talk . Place the cuff on your bare arm . Adjust snuggly, so that only two fingertips can fit between your skin and the top of the cuff . Check 2 readings separated by at least one minute . Keep a log of your BP readings . For a visual, please reference this diagram: http://ccnc.care/bpdiagram  Provider Name: [facility]     Phone: [phone]  Zone 1: ALL CLEAR  Continue to monitor your symptoms:  . BP reading is less than 140 (top number) or less than 90 (bottom number)  . No right upper stomach pain . No headaches or seeing spots . No feeling nauseated or throwing up . No swelling in face and hands  Zone 2: CAUTION Call your doctor's office for any of the following:  . BP reading is greater than 140 (top number) or greater than 90 (bottom number)  . Stomach pain under your ribs in the middle or right side . Headaches or seeing spots . Feeling nauseated or throwing up . Swelling in face and hands  Zone 3: EMERGENCY  Seek immediate medical care if you have any of the  following:  . BP reading is greater than160 (top number) or greater than 110 (bottom number) . Severe headaches not improving with Tylenol . Serious difficulty catching your breath . Any worsening symptoms from Zone 2   Braxton Hicks Contractions Contractions of the uterus can occur throughout pregnancy, but they are not always a sign that you are in labor. You may have practice contractions called Braxton Hicks contractions. These false labor contractions are sometimes confused with true labor. What are [place] contractions? Braxton Hicks contractions are tightening movements that occur in the muscles of the uterus before labor. Unlike true labor contractions, these contractions do  not result in opening (dilation) and thinning of the cervix. Toward the end of pregnancy (32-34 weeks), Braxton Hicks contractions can happen more often and may become stronger. These contractions are sometimes difficult to tell apart from true labor because they can be very uncomfortable. You should not feel embarrassed if you go to the hospital with false labor. Sometimes, the only way to tell if you are in true labor is for your health care provider to look for changes in the cervix. The health care provider will do a physical exam and may monitor your contractions. If you are not in true labor, the exam should show that your cervix is not dilating and your water has not broken. If there are no other health problems associated with your pregnancy, it is completely safe for you to be sent home with false labor. You may continue to have Braxton Hicks contractions until you go into true labor. How to tell the difference between true labor and false labor True labor  Contractions last 30-70 seconds.  Contractions become very regular.  Discomfort is usually felt in the top of the uterus, and it spreads to the lower abdomen and low back.  Contractions do not go away with walking.  Contractions usually become more intense and increase in frequency.  The cervix dilates and gets thinner. False labor  Contractions are usually shorter and not as strong as true labor contractions.  Contractions are usually irregular.  Contractions are often felt in the front of the lower abdomen and in the groin.  Contractions may go away when you walk around or change positions while lying down.  Contractions get weaker and are shorter-lasting as time goes on.  The cervix usually does not dilate or become thin. Follow these instructions at home:  1. Take over-the-counter and prescription medicines only as told by your health care provider. 2. Keep up with your usual exercises and follow other instructions  from your health care provider. 3. Eat and drink lightly if you think you are going into labor. 4. If Braxton Hicks contractions are making you uncomfortable: ? Change your position from lying down or resting to walking, or change from walking to resting. ? Sit and rest in a tub of warm water. ? Drink enough fluid to keep your urine pale yellow. Dehydration may cause these contractions. ? Do slow and deep breathing several times an hour. 5. Keep all follow-up prenatal visits as told by your health care provider. This is important. Contact a health care provider if:  You have a fever.  You have continuous pain in your abdomen. Get help right away if:  Your contractions become stronger, more regular, and closer together.  You have fluid leaking or gushing from your vagina.  You pass blood-tinged mucus (bloody show).  You have bleeding from your vagina.  You have low back pain  that you never had before.  You feel your baby's head pushing down and causing pelvic pressure.  Your baby is not moving inside you as much as it used to. Summary  Contractions that occur before labor are called Braxton Hicks contractions, false labor, or practice contractions.  Braxton Hicks contractions are usually shorter, weaker, farther apart, and less regular than true labor contractions. True labor contractions usually become progressively stronger and regular, and they become more frequent.  Manage discomfort from [facility] contractions by changing position, resting in a warm bath, drinking plenty of water, or practicing deep breathing. This information is not intended to replace advice given to you by your health care provider. Make sure you discuss any questions you have with your health care provider. Document Revised: 12/12/2016 Document Reviewed: 05/15/2016 Elsevier Patient Education  [employer].

## 2020-03-30 ENCOUNTER — Inpatient Hospital Stay (HOSPITAL_COMMUNITY)
Admission: AD | Admit: 2020-03-30 | Discharge: 2020-03-30 | Disposition: A | Discharge status: Home / Self Care | Payer: Medicaid Other | Attending: Obstetrics and Gynecology | Admitting: Obstetrics and Gynecology

## 2020-03-30 ENCOUNTER — Other Ambulatory Visit: Payer: Self-pay

## 2020-03-30 ENCOUNTER — Telehealth: Payer: Self-pay

## 2020-03-30 ENCOUNTER — Encounter (HOSPITAL_COMMUNITY): Payer: Self-pay | Admitting: Obstetrics and Gynecology

## 2020-03-30 DIAGNOSIS — O99891 Other specified diseases and conditions complicating pregnancy: Secondary | ICD-10-CM | POA: Insufficient documentation

## 2020-03-30 DIAGNOSIS — Z3A38 38 weeks gestation of pregnancy: Secondary | ICD-10-CM | POA: Diagnosis not present

## 2020-03-30 DIAGNOSIS — R1110 Vomiting, unspecified: Secondary | ICD-10-CM

## 2020-03-30 DIAGNOSIS — R030 Elevated blood-pressure reading, without diagnosis of hypertension: Secondary | ICD-10-CM

## 2020-03-30 DIAGNOSIS — R197 Diarrhea, unspecified: Secondary | ICD-10-CM | POA: Diagnosis not present

## 2020-03-30 DIAGNOSIS — Z3403 Encounter for supervision of normal first pregnancy, third trimester: Secondary | ICD-10-CM

## 2020-03-30 DIAGNOSIS — O26899 Other specified pregnancy related conditions, unspecified trimester: Secondary | ICD-10-CM

## 2020-03-30 DIAGNOSIS — O26893 Other specified pregnancy related conditions, third trimester: Secondary | ICD-10-CM | POA: Diagnosis not present

## 2020-03-30 LAB — COMPREHENSIVE METABOLIC PANEL
ALT: 13 U/L (ref 0–44)
AST: 23 U/L (ref 15–41)
Albumin: 2.9 g/dL — ABNORMAL LOW (ref 3.5–5.0)
Alkaline Phosphatase: 150 U/L — ABNORMAL HIGH (ref 38–126)
Anion gap: 11 (ref 5–15)
BUN: 7 mg/dL (ref 6–20)
CO2: 19 mmol/L — ABNORMAL LOW (ref 22–32)
Calcium: 8.9 mg/dL (ref 8.9–10.3)
Chloride: 105 mmol/L (ref 98–111)
Creatinine, Ser: 0.67 mg/dL (ref 0.44–1.00)
GFR, Estimated: >60 mL/min (ref >60)
Glucose, Bld: 95 mg/dL (ref 70–99)
Potassium: 3.7 mmol/L (ref 3.5–5.1)
Sodium: 135 mmol/L (ref 135–145)
Total Bilirubin: 0.8 mg/dL (ref 0.3–1.2)
Total Protein: 6.1 g/dL — ABNORMAL LOW (ref 6.5–8.1)

## 2020-03-30 LAB — CBC
HCT: 33.6 % — ABNORMAL LOW (ref 36.0–46.0)
Hemoglobin: 11 g/dL — ABNORMAL LOW (ref 12.0–15.0)
MCH: 29.2 pg (ref 26.0–34.0)
MCHC: 32.7 g/dL (ref 30.0–36.0)
MCV: 89.1 fL (ref 80.0–100.0)
Platelets: 203 10*3/uL (ref 150–400)
RBC: 3.77 MIL/uL — ABNORMAL LOW (ref 3.87–5.11)
RDW: 12.8 % (ref 11.5–15.5)
WBC: 13.2 10*3/uL — ABNORMAL HIGH (ref 4.0–10.5)
nRBC: 0 % (ref 0.0–0.2)

## 2020-03-30 LAB — URINALYSIS, ROUTINE W REFLEX MICROSCOPIC
Bilirubin Urine: NEGATIVE
Glucose, UA: NEGATIVE mg/dL
Hgb urine dipstick: NEGATIVE
Ketones, ur: NEGATIVE mg/dL
Nitrite: NEGATIVE
Protein, ur: NEGATIVE mg/dL
Specific Gravity, Urine: 1.025 (ref 1.005–1.030)
pH: 6 (ref 5.0–8.0)

## 2020-03-30 LAB — PROTEIN / CREATININE RATIO, URINE
Creatinine, Urine: 89.11 mg/dL
Protein Creatinine Ratio: 0.1 mg/mg{Cre} (ref 0.00–0.15)
Total Protein, Urine: 9 mg/dL

## 2020-03-30 LAB — URINALYSIS, MICROSCOPIC (REFLEX)

## 2020-03-30 MED ORDER — LACTATED RINGERS IV BOLUS
1000.0000 mL | Freq: Once | INTRAVENOUS | Status: AC
  Administered 2020-03-30: 1000 mL via INTRAVENOUS

## 2020-03-30 MED ORDER — PROMETHAZINE HCL 12.5 MG PO TABS: 12.5000 mg | ORAL_TABLET | Freq: Four times a day (QID) | ORAL | 0 refills | Status: DC | PRN

## 2020-03-30 MED ORDER — ACETAMINOPHEN 160 MG/5ML PO SOLN
650.0000 mg | Freq: Once | ORAL | Status: AC
  Administered 2020-03-30: 650 mg via ORAL
  Filled 2020-03-30: qty 20.3

## 2020-03-30 MED ORDER — SODIUM CHLORIDE 0.9 % IV SOLN
25.0000 mg | Freq: Once | INTRAVENOUS | Status: AC
  Administered 2020-03-30: 25 mg via INTRAVENOUS
  Filled 2020-03-30: qty 1

## 2020-03-30 NOTE — Discharge Instructions (Signed)

## 2020-03-30 NOTE — MAU Note (Addendum)
...  Samantha Patterson is a 20 y.o. at [redacted]w[redacted]d here in MAU reporting: diarrhea since last night around 2200. She reports she ate [employer] at 1830. She attempted to eat a bowl of cereal this morning at 0730 and had one episode of emesis. She reports light vaginal spotting yesterday that has ceased but reports she had her cervix checked yesterday in office and was 2cm. +FM.  Last diarrhea occurrence at 0800.   Pain score: 3/10 HA FHT: 136 initial Lab orders placed from triage:  UA

## 2020-03-30 NOTE — MAU Provider Note (Signed)
History     CSN: [id number]  Arrival date and time: 03/30/20 1136   Event Date/Time   First Provider Initiated Contact with Patient 03/30/20 1342      Chief Complaint  Patient presents with  . Emesis  . Diarrhea   HPI Samantha Patterson is a 20 y.o. G1P0 at [redacted]w[redacted]d who presents to MAU with chief complaint of diarrhea and vomiting. These are new problems, onset last night after eating [employer]. Most recent episode of diarrahe was at 0800 this morning. Most recent episode of vomiting was at 0730 after attempting to eat a bowl of cereal.  Patient is not sure if she is having contractions. She denies vaginal bleeding, leaking of fluid, decreased fetal movement, fever, falls, or recent illness.   She receives care with [facility] and her next appointment is 04/03/2020.  OB History    Gravida  1   Para      Term      Preterm      AB      Living        SAB      IAB      Ectopic      Multiple      Live Births              Past Medical History:  Diagnosis Date  . Medical history non-contributory     Past Surgical History:  Procedure Laterality Date  . NO PAST SURGERIES      Family History  Problem Relation Age of Onset  . Migraines Mother   . Migraines Father   . Migraines Sister   . Heart attack Maternal Grandfather     Social History   Tobacco Use  . Smoking status: Never Smoker  . Smokeless tobacco: Never Used  . Tobacco comment: mom and dad smokes outside  Vaping Use  . Vaping Use: Never used  Substance Use Topics  . Alcohol use: Never    Alcohol/week: 0.0 standard drinks  . Drug use: Never    Allergies: No Known Allergies  Medications Prior to Admission  Medication Sig Dispense Refill Last Dose  . pantoprazole (PROTONIX) 20 MG tablet Take 1 tablet (20 mg total) by mouth daily. 30 tablet 3 03/29/2020 at Unknown time  . Pediatric Multivitamins-Iron (FLINTSTONES COMPLETE) 18 MG CHEW Chew 2 daily (Patient not taking: No sig reported)        Review of Systems  Gastrointestinal: Positive for diarrhea, nausea and vomiting. Negative for abdominal pain.  Genitourinary: Negative for vaginal bleeding.  Musculoskeletal: Negative for back pain.  All other systems reviewed and are negative.  Physical Exam   Blood pressure 112/65, pulse 87, temperature 98.3 F (36.8 C), temperature source Oral, resp. rate 19, height 5\' 8"  (1.727 m), weight 91.4 kg, last menstrual period 07/05/2019, SpO2 100 %.  Physical Exam Vitals and nursing note reviewed. Exam conducted with a chaperone present.  Constitutional:      Appearance: Normal appearance.  Cardiovascular:     Pulses: Normal pulses.     Heart sounds: Normal heart sounds.  Pulmonary:     Effort: Pulmonary effort is normal.     Breath sounds: Normal breath sounds.  Abdominal:     Comments: Gravid  Skin:    Capillary Refill: Capillary refill takes less than 2 seconds.  Neurological:     Mental Status: She is alert and oriented to person, place, and time.  Psychiatric:        Mood and Affect: Mood  normal.        Behavior: Behavior normal.        Thought Content: Thought content normal.        Judgment: Judgment normal.     MAU Course  Procedures  --New onset elevated BP x 1, no history of HTN. Given GA will run PEC labs --PEC labs WNL --Reactive tracing: baseline 135, mod var, + 15 x 15 accel --Toco: rare contraction not felt by patient, UI  Patient Vitals for the past 24 hrs:  BP Temp Temp src Pulse Resp SpO2 Height Weight  03/30/20 1408 - 98.1 F (36.7 C) Oral 97 17 100 % - -  03/30/20 1345 112/65 - - 87 - 100 % - -  03/30/20 1330 124/82 - - 94 - 100 % - -  03/30/20 1315 125/89 - - 87 - 100 % - -  03/30/20 1300 115/75 - - 98 - 100 % - -  03/30/20 1245 119/80 - - (!) 111 - 99 % - -  03/30/20 1230 139/87 - - 97 - 100 % - -  03/30/20 1211 140/88 98.3 F (36.8 C) Oral (!) 125 19 100 % - -  03/30/20 1208 - - - - - - 5\' 8"  (1.727 m) 91.4 kg   Results for orders  placed or performed during the hospital encounter of 03/30/20 (from the past 24 hour(s))  Urinalysis, Routine w reflex microscopic Urine, Clean Catch     Status: Abnormal   Collection Time: 03/30/20 12:14 PM  Result Value Ref Range   Color, Urine YELLOW YELLOW   APPearance CLEAR CLEAR   Specific Gravity, Urine 1.025 1.005 - 1.030   pH 6.0 5.0 - 8.0   Glucose, UA NEGATIVE NEGATIVE mg/dL   Hgb urine dipstick NEGATIVE NEGATIVE   Bilirubin Urine NEGATIVE NEGATIVE   Ketones, ur NEGATIVE NEGATIVE mg/dL   Protein, ur NEGATIVE NEGATIVE mg/dL   Nitrite NEGATIVE NEGATIVE   Leukocytes,Ua LARGE (A) NEGATIVE  Protein / creatinine ratio, urine     Status: None   Collection Time: 03/30/20 12:14 PM  Result Value Ref Range   Creatinine, Urine 89.11 mg/dL   Total Protein, Urine 9 mg/dL   Protein Creatinine Ratio 0.10 0.00 - 0.15 mg/mg[Cre]  Urinalysis, Microscopic (reflex)     Status: Abnormal   Collection Time: 03/30/20 12:14 PM  Result Value Ref Range   RBC / HPF 0-5 0 - 5 RBC/hpf   WBC, UA 11-20 0 - 5 WBC/hpf   Bacteria, UA MANY (A) NONE SEEN   Squamous Epithelial / LPF 6-10 0 - 5  CBC     Status: Abnormal   Collection Time: 03/30/20 12:29 PM  Result Value Ref Range   WBC 13.2 (H) 4.0 - 10.5 K/uL   RBC 3.77 (L) 3.87 - 5.11 MIL/uL   Hemoglobin 11.0 (L) 12.0 - 15.0 g/dL   HCT 33.6 (L) 36.0 - 46.0 %   MCV 89.1 80.0 - 100.0 fL   MCH 29.2 26.0 - 34.0 pg   MCHC 32.7 30.0 - 36.0 g/dL   RDW 12.8 11.5 - 15.5 %   Platelets 203 150 - 400 K/uL   nRBC 0.0 0.0 - 0.2 %  Comprehensive metabolic panel     Status: Abnormal   Collection Time: 03/30/20 12:29 PM  Result Value Ref Range   Sodium 135 135 - 145 mmol/L   Potassium 3.7 3.5 - 5.1 mmol/L   Chloride 105 98 - 111 mmol/L   CO2 19 (L) 22 - 32  mmol/L   Glucose, Bld 95 70 - 99 mg/dL   BUN 7 6 - 20 mg/dL   Creatinine, Ser 0.67 0.44 - 1.00 mg/dL   Calcium 8.9 8.9 - 10.3 mg/dL   Total Protein 6.1 (L) 6.5 - 8.1 g/dL   Albumin 2.9 (L) 3.5 - 5.0 g/dL    AST 23 15 - 41 U/L   ALT 13 0 - 44 U/L   Alkaline Phosphatase 150 (H) 38 - 126 U/L   Total Bilirubin 0.8 0.3 - 1.2 mg/dL   GFR, Estimated >60 >60 mL/min   Anion gap 11 5 - 15   Meds ordered this encounter  Medications  . promethazine (PHENERGAN) 25 mg in sodium chloride 0.9 % 50 mL IVPB  . acetaminophen (TYLENOL) 160 MG/5ML solution 650 mg  . lactated ringers bolus 1,000 mL  . promethazine (PHENERGAN) 12.5 MG tablet    Sig: Take 1 tablet (12.5 mg total) by mouth every 6 (six) hours as needed for nausea or vomiting.    Dispense:  30 tablet    Refill:  0    Order Specific Question:   Supervising Provider    Answer:   Sloan Leiter [[id number]]    Assessment and Plan  --20 y.o. G1P0 at [redacted]w[redacted]d  --Diarrhea likely triggered by PO intake --Emesis x 1 episode --No episodes of vomiting or diarrhea in MAU --Elevated BP x 1, PEC labs WNL --Discharge home in stable condition  F/U: --Next appointment at [facility] is 04/03/2020  Darlina Rumpf, [place] 03/30/2020, 4:53 PM

## 2020-03-31 LAB — CULTURE, OB URINE: Culture: <10000 — AB

## 2020-04-03 ENCOUNTER — Other Ambulatory Visit: Payer: Self-pay

## 2020-04-03 ENCOUNTER — Encounter: Payer: Self-pay | Admitting: Obstetrics & Gynecology

## 2020-04-03 ENCOUNTER — Ambulatory Visit (INDEPENDENT_AMBULATORY_CARE_PROVIDER_SITE_OTHER): Payer: Medicaid Other | Admitting: Obstetrics & Gynecology

## 2020-04-03 VITALS — BP 125/89 | HR 118 | Wt 203.0 lb

## 2020-04-03 DIAGNOSIS — Z3403 Encounter for supervision of normal first pregnancy, third trimester: Secondary | ICD-10-CM

## 2020-04-03 NOTE — Progress Notes (Signed)
   LOW-RISK PREGNANCY VISIT Patient name: Samantha Patterson MRN [id number]  Date of birth: [birth year] Chief Complaint:   Routine Prenatal Visit  History of Present Illness:   Samantha Patterson is a 20 y.o. G1P0 female at [redacted]w[redacted]d with an Estimated Date of Delivery: 04/10/20 being seen today for ongoing management of a low-risk pregnancy.  Depression screen [facility] 2/9 01/25/2020 10/03/2019 11/23/2013  Decreased Interest 1 2 2   Down, Depressed, Hopeless 1 1 3   PHQ - 2 Score 2 3 5   Altered sleeping 1 2 3   Tired, decreased energy 1 2 2   Change in appetite 1 2 3   Feeling bad or failure about yourself  1 2 2   Trouble concentrating 1 2 2   Moving slowly or fidgety/restless 1 0 2  Suicidal thoughts 0 0 0  PHQ-9 Score 8 13 19     Today she reports no complaints. Contractions: Irregular. Vag. Bleeding: None.  Movement: Present. denies leaking of fluid. Review of Systems:   Pertinent items are noted in HPI Denies abnormal vaginal discharge w/ itching/odor/irritation, headaches, visual changes, shortness of breath, chest pain, abdominal pain, severe nausea/vomiting, or problems with urination or bowel movements unless otherwise stated above. Pertinent History Reviewed:  Reviewed past medical,surgical, social, obstetrical and family history.  Reviewed problem list, medications and allergies. Physical Assessment:   Vitals:   04/03/20 0957  BP: 125/89  Pulse: (!) 118  Weight: 203 lb (92.1 kg)  Body mass index is 30.87 kg/m.        Physical Examination:   General appearance: Well appearing, and in no distress  Mental status: Alert, oriented to person, place, and time  Skin: Warm & dry  Cardiovascular: Normal heart rate noted  Respiratory: Normal respiratory effort, no distress  Abdomen: Soft, gravid, nontender  Pelvic: Cervical exam performed  Dilation: 2.5 Effacement (%): 50 Station: -2  Extremities: Edema: Trace  Fetal Status: Fetal Heart Rate (bpm): 142 Fundal Height: 36 cm Movement: Present  Presentation: Vertex  Chaperone: Latisha Cresenzo    No results found for this or any previous visit (from the past 24 hour(s)).  Assessment & Plan:  1) Low-risk pregnancy G1P0 at [redacted]w[redacted]d with an Estimated Date of Delivery: 04/10/20   2) Borderline BP, membranes swept today, NST next week if undelivered,    Meds: No orders of the defined types were placed in this encounter.  Labs/procedures today:   Plan:  Continue routine obstetrical care  Next visit: prefers in person    Reviewed: Term labor symptoms and general obstetric precautions including but not limited to vaginal bleeding, contractions, leaking of fluid and fetal movement were reviewed in detail with the patient.  All questions were answered. Has home bp cuff. Rx faxed to . Check bp weekly, let us know if >140/90.   Follow-up: Return in about 1 week (around 04/10/2020) for NST, LROB.  No orders of the defined types were placed in this encounter.   Florian Buff, MD 04/03/2020 10:41 AM

## 2020-04-04 ENCOUNTER — Encounter (HOSPITAL_COMMUNITY): Payer: Self-pay | Admitting: Obstetrics & Gynecology

## 2020-04-04 ENCOUNTER — Inpatient Hospital Stay (HOSPITAL_COMMUNITY)
Admission: AD | Admit: 2020-04-04 | Discharge: 2020-04-07 | DRG: 807 | Disposition: A | Discharge status: Home / Self Care | Payer: Medicaid Other | Attending: Obstetrics & Gynecology | Admitting: Obstetrics & Gynecology

## 2020-04-04 ENCOUNTER — Other Ambulatory Visit: Payer: Self-pay

## 2020-04-04 DIAGNOSIS — G44219 Episodic tension-type headache, not intractable: Secondary | ICD-10-CM | POA: Diagnosis present

## 2020-04-04 DIAGNOSIS — Z8759 Personal history of other complications of pregnancy, childbirth and the puerperium: Secondary | ICD-10-CM | POA: Diagnosis present

## 2020-04-04 DIAGNOSIS — O26899 Other specified pregnancy related conditions, unspecified trimester: Secondary | ICD-10-CM

## 2020-04-04 DIAGNOSIS — O134 Gestational [pregnancy-induced] hypertension without significant proteinuria, complicating childbirth: Principal | ICD-10-CM | POA: Diagnosis present

## 2020-04-04 DIAGNOSIS — Z6791 Unspecified blood type, Rh negative: Secondary | ICD-10-CM | POA: Diagnosis not present

## 2020-04-04 DIAGNOSIS — O139 Gestational [pregnancy-induced] hypertension without significant proteinuria, unspecified trimester: Secondary | ICD-10-CM | POA: Diagnosis present

## 2020-04-04 DIAGNOSIS — O326XX0 Maternal care for compound presentation, not applicable or unspecified: Secondary | ICD-10-CM | POA: Diagnosis not present

## 2020-04-04 DIAGNOSIS — F419 Anxiety disorder, unspecified: Secondary | ICD-10-CM | POA: Diagnosis present

## 2020-04-04 DIAGNOSIS — Z3A39 39 weeks gestation of pregnancy: Secondary | ICD-10-CM

## 2020-04-04 DIAGNOSIS — O26893 Other specified pregnancy related conditions, third trimester: Secondary | ICD-10-CM | POA: Diagnosis present

## 2020-04-04 DIAGNOSIS — O479 False labor, unspecified: Secondary | ICD-10-CM

## 2020-04-04 DIAGNOSIS — O133 Gestational [pregnancy-induced] hypertension without significant proteinuria, third trimester: Secondary | ICD-10-CM | POA: Diagnosis not present

## 2020-04-04 LAB — COMPREHENSIVE METABOLIC PANEL
ALT: 11 U/L (ref 0–44)
AST: 20 U/L (ref 15–41)
Albumin: 3 g/dL — ABNORMAL LOW (ref 3.5–5.0)
Alkaline Phosphatase: 161 U/L — ABNORMAL HIGH (ref 38–126)
Anion gap: 10 (ref 5–15)
BUN: 6 mg/dL (ref 6–20)
CO2: 17 mmol/L — ABNORMAL LOW (ref 22–32)
Calcium: 8.9 mg/dL (ref 8.9–10.3)
Chloride: 108 mmol/L (ref 98–111)
Creatinine, Ser: 0.59 mg/dL (ref 0.44–1.00)
GFR, Estimated: >60 mL/min (ref >60)
Glucose, Bld: 94 mg/dL (ref 70–99)
Potassium: 3.9 mmol/L (ref 3.5–5.1)
Sodium: 135 mmol/L (ref 135–145)
Total Bilirubin: 0.7 mg/dL (ref 0.3–1.2)
Total Protein: 6.3 g/dL — ABNORMAL LOW (ref 6.5–8.1)

## 2020-04-04 LAB — CBC
HCT: 33.4 % — ABNORMAL LOW (ref 36.0–46.0)
Hemoglobin: 11.1 g/dL — ABNORMAL LOW (ref 12.0–15.0)
MCH: 29.4 pg (ref 26.0–34.0)
MCHC: 33.2 g/dL (ref 30.0–36.0)
MCV: 88.4 fL (ref 80.0–100.0)
Platelets: 193 10*3/uL (ref 150–400)
RBC: 3.78 MIL/uL — ABNORMAL LOW (ref 3.87–5.11)
RDW: 12.9 % (ref 11.5–15.5)
WBC: 18 10*3/uL — ABNORMAL HIGH (ref 4.0–10.5)
nRBC: 0 % (ref 0.0–0.2)

## 2020-04-04 MED ORDER — ONDANSETRON HCL 4 MG/2ML IJ SOLN
4.0000 mg | Freq: Four times a day (QID) | INTRAMUSCULAR | Status: DC | PRN
  Administered 2020-04-05: 4 mg via INTRAVENOUS
  Filled 2020-04-04: qty 2

## 2020-04-04 MED ORDER — OXYTOCIN BOLUS FROM INFUSION
333.0000 mL | Freq: Once | INTRAVENOUS | Status: AC
  Administered 2020-04-05: 333 mL via INTRAVENOUS

## 2020-04-04 MED ORDER — FENTANYL CITRATE (PF) 100 MCG/2ML IJ SOLN
50.0000 ug | INTRAMUSCULAR | Status: DC | PRN
  Administered 2020-04-05 (×2): 100 ug via INTRAVENOUS
  Filled 2020-04-04 (×2): qty 2

## 2020-04-04 MED ORDER — LACTATED RINGERS IV SOLN
500.0000 mL | INTRAVENOUS | Status: DC | PRN
Start: 2020-04-04 — End: 2020-04-05

## 2020-04-04 MED ORDER — SOD CITRATE-CITRIC ACID 500-334 MG/5ML PO SOLN: 30.0000 mL | ORAL | Status: DC | PRN

## 2020-04-04 MED ORDER — LIDOCAINE HCL (PF) 1 % IJ SOLN: 30.0000 mL | INTRAMUSCULAR | Status: DC | PRN

## 2020-04-04 MED ORDER — OXYCODONE-ACETAMINOPHEN 5-325 MG PO TABS: 2.0000 | ORAL_TABLET | ORAL | Status: DC | PRN

## 2020-04-04 MED ORDER — ACETAMINOPHEN 500 MG PO TABS: 1000.0000 mg | ORAL_TABLET | Freq: Once | ORAL | Status: DC

## 2020-04-04 MED ORDER — LACTATED RINGERS IV SOLN: INTRAVENOUS | Status: DC

## 2020-04-04 MED ORDER — OXYTOCIN-SODIUM CHLORIDE 30-0.9 UT/500ML-% IV SOLN
2.5000 [IU]/h | INTRAVENOUS | Status: DC
  Administered 2020-04-05: 2.5 [IU]/h via INTRAVENOUS

## 2020-04-04 MED ORDER — OXYCODONE-ACETAMINOPHEN 5-325 MG PO TABS: 1.0000 | ORAL_TABLET | ORAL | Status: DC | PRN

## 2020-04-04 MED ORDER — ACETAMINOPHEN 325 MG PO TABS: 650.0000 mg | ORAL_TABLET | ORAL | Status: DC | PRN

## 2020-04-04 NOTE — MAU Note (Signed)
..  Samantha Patterson is a 20 y.o. at [redacted]w[redacted]d here in MAU reporting: contractions since 6pm that are every 3-4 minutes. Denies vaginal bleeding or leaking of fluid. Reports a lot of discharge. +FM 4/10 headache Pain score: 8/10  Last SVE: 2.5

## 2020-04-04 NOTE — MAU Provider Note (Cosign Needed Addendum)
Chief Complaint:  No chief complaint on file.   Event Date/Time   First Provider Initiated Contact with Patient 04/04/20 2252     HPI: Samantha Patterson is a 20 y.o. G1P0 at 33w1dwho presents to maternity admissions reporting painful contractions all day. Noted to be hypertensive, so I was asked to see her. .States has had a constant headache all week. She reports good fetal movement, denies LOF, vaginal bleeding, vaginal itching/burning, urinary symptoms, h/a, dizziness, n/v, diarrhea, constipation or fever/chills.  She denies visual changes or RUQ abdominal pain.  Abdominal Pain This is a new problem. The current episode started today. The onset quality is gradual. The problem occurs intermittently. The problem has been unchanged. The quality of the pain is cramping. Associated symptoms include headaches. Pertinent negatives include no constipation, diarrhea, dysuria, fever, frequency, melena or nausea. The pain is relieved by nothing. She has tried nothing for the symptoms.    Past Medical History: Past Medical History:  Diagnosis Date  . Medical history non-contributory     Past obstetric history: OB History  Gravida Para Term Preterm AB Living  1            SAB IAB Ectopic Multiple Live Births               # Outcome Date GA Lbr Len/2nd Weight Sex Delivery Anes PTL Lv  1 Current             Past Surgical History: Past Surgical History:  Procedure Laterality Date  . NO PAST SURGERIES      Family History: Family History  Problem Relation Age of Onset  . Migraines Mother   . Migraines Father   . Migraines Sister   . Heart attack Maternal Grandfather     Social History: Social History   Tobacco Use  . Smoking status: Never Smoker  . Smokeless tobacco: Never Used  . Tobacco comment: mom and dad smokes outside  Vaping Use  . Vaping Use: Never used  Substance Use Topics  . Alcohol use: Never    Alcohol/week: 0.0 standard drinks  . Drug use: Never    Allergies: No  Known Allergies  Meds:  Medications Prior to Admission  Medication Sig Dispense Refill Last Dose  . pantoprazole (PROTONIX) 20 MG tablet Take 1 tablet (20 mg total) by mouth daily. 30 tablet 3 04/03/2020 at Unknown time  . Pediatric Multivitamins-Iron (FLINTSTONES COMPLETE) 18 MG CHEW Chew 2 daily (Patient not taking: No sig reported)     . promethazine (PHENERGAN) 12.5 MG tablet Take 1 tablet (12.5 mg total) by mouth every 6 (six) hours as needed for nausea or vomiting. (Patient not taking: Reported on 04/03/2020) 30 tablet 0     I have reviewed patient's Past Medical Hx, Surgical Hx, Family Hx, Social Hx, medications and allergies.   ROS:  Review of Systems  Constitutional: Negative for fever.  Gastrointestinal: Positive for abdominal pain. Negative for constipation, diarrhea, melena and nausea.  Genitourinary: Negative for dysuria and frequency.  Neurological: Positive for headaches.   Other systems negative  Physical Exam   Patient Vitals for the past 24 hrs:  BP Temp Temp src Pulse Resp SpO2 Height Weight  04/04/20 2245 (!) 153/92 98.3 F (36.8 C) Oral 94 20 100 % - -  04/04/20 2239 - - - - - - 5\' 8"  (1.727 m) 92.6 kg    Constitutional: Well-developed, well-nourished female in no acute distress.  Cardiovascular: normal rate and rhythm Respiratory: normal effort, clear to  auscultation bilaterally GI: Abd soft, non-tender, gravid appropriate for gestational age.   No rebound or guarding. MS: Extremities nontender, Trace edema, normal ROM Neurologic: Alert and oriented x 4.   DTRs 3+ no clonus GU: Neg CVAT.  PELVIC EXAM:  Dilation: 2.5 Effacement (%): 70 Station: -2 Presentation: Vertex Exam by:: Elray Mcgregor, RN  FHT:  Baseline 135 , moderate variability, accelerations present, no decelerations Contractions: q 2-4 mins Irregular     Labs: Pending O/Negative/-- (09/20 1103)  Imaging:    MAU Course/MDM: I have ordered labs to evaluate HTN NST reviewed Consult  Dr Clementeen Hoof with presentation, exam findings and test results.  Treatments in MAU included EFM.    Assessment: [place] IUP at [redacted]w[redacted]d Painful contractions Gestational Hypertension, rule out preeclampsia Headache x 1 week  Plan: Admit for augmentation of labor Routine orders Labor team to follow  Hansel Feinstein CNM, MSN Certified Nurse-Midwife 04/04/2020 11:18 PM

## 2020-04-04 NOTE — H&P (Signed)
OBSTETRIC ADMISSION HISTORY AND PHYSICAL  Samantha Patterson is a 20 y.o. female G1P0 with IUP at [redacted]w[redacted]d by L/8 presenting for IOL due to new-onset of gHTN tonight during MAU visit for contractions (had elevated value initially 03/30/20). She reports +FM, +intermittent mild headaches, +SOB, no LOF, no VB, no blurry vision, peripheral edema, or RUQ pain. She reports contractions every 3-4 minutes. She plans on breastfeeding. She requests depo for birth control. She received her prenatal care at [facility].   Dating: By L/8 --->  Estimated Date of Delivery: 04/10/20  Sono:    @[redacted]w[redacted]d , CWD, normal anatomy, cephalic presentation, anterior lie, 2802g, 69% EFW   Prenatal History/Complications:   -- High blood pressure in pregnancy -- Headaches -- Shortness of breath  Past Medical History: Past Medical History:  Diagnosis Date  . Medical history non-contributory     Past Surgical History: Past Surgical History:  Procedure Laterality Date  . NO PAST SURGERIES      Obstetrical History: OB History    Gravida  1   Para      Term      Preterm      AB      Living        SAB      IAB      Ectopic      Multiple      Live Births              Social History Social History   Socioeconomic History  . Marital status: Single    Spouse name: Not on file  . Number of children: Not on file  . Years of education: Not on file  . Highest education level: Not on file  Occupational History  . Not on file  Tobacco Use  . Smoking status: Never Smoker  . Smokeless tobacco: Never Used  . Tobacco comment: mom and dad smokes outside  Vaping Use  . Vaping Use: Never used  Substance and Sexual Activity  . Alcohol use: Never    Alcohol/week: 0.0 standard drinks  . Drug use: Never  . Sexual activity: Yes    Birth control/protection: None  Other Topics Concern  . Not on file  Social History Narrative   Samantha Patterson is a 11th grade student.   She attends [employer].  She is doing well.    She lives with her father, sister, and brother. Her brother is 89 yo and her sister is 75 yo.    She enjoys sleeping, watching Netflix and theater.   Social Determinants of Health   Financial Resource Strain: Medium Risk  . Difficulty of Paying Living Expenses: Somewhat hard  Food Insecurity: No Food Insecurity  . Worried About Charity fundraiser in the Last Year: Never true  . Ran Out of Food in the Last Year: Never true  Transportation Needs: No Transportation Needs  . Lack of Transportation (Medical): No  . Lack of Transportation (Non-Medical): No  Physical Activity: Insufficiently Active  . Days of Exercise per Week: 3 days  . Minutes of Exercise per Session: 20 min  Stress: No Stress Concern Present  . Feeling of Stress : Only a little  Social Connections: Moderately Isolated  . Frequency of Communication with Friends and Family: Once a week  . Frequency of Social Gatherings with Friends and Family: Once a week  . Attends Religious Services: 1 to 4 times per year  . Active Member of Clubs or Organizations: No  . Attends  Club or Organization Meetings: Never  . Marital Status: Living with partner    Family History: Family History  Problem Relation Age of Onset  . Migraines Mother   . Migraines Father   . Migraines Sister   . Heart attack Maternal Grandfather     Allergies: No Known Allergies  Medications Prior to Admission  Medication Sig Dispense Refill Last Dose  . pantoprazole (PROTONIX) 20 MG tablet Take 1 tablet (20 mg total) by mouth daily. 30 tablet 3 04/03/2020 at Unknown time  . Pediatric Multivitamins-Iron (FLINTSTONES COMPLETE) 18 MG CHEW Chew 2 daily (Patient not taking: No sig reported)     . promethazine (PHENERGAN) 12.5 MG tablet Take 1 tablet (12.5 mg total) by mouth every 6 (six) hours as needed for nausea or vomiting. (Patient not taking: Reported on 04/03/2020) 30 tablet 0      Review of Systems   All systems reviewed and  negative except as stated in HPI  Blood pressure (!) 119/94, pulse (!) 109, temperature 98.4 F (36.9 C), temperature source Oral, resp. rate 17, height 5\' 8"  (1.727 m), weight 92.6 kg, last menstrual period 07/05/2019, SpO2 100 %. General appearance: alert and no distress Lungs: clear to auscultation bilaterally Heart: regular rate and rhythm Abdomen: soft, non-tender; bowel sounds normal Extremities: no sign of DVT Presentation: cephalic Fetal monitoringBaseline: 135 bpm, Variability: Good {> 6 bpm), Accelerations: Reactive and Decelerations: Absent Uterine activityFrequency: Every 2-4 minutes Dilation: 2.5 Effacement (%): 70 Station: -2 Exam by:: Elray Mcgregor, RN   Prenatal labs: ABO, Rh: --/--/O NEG (03/23 2317) Antibody: POS (03/23 2317) Rubella: 1.84 (09/20 1103) RPR: Non Reactive (01/12 0841)  HBsAg: Negative (09/20 1103)  HIV: Non Reactive (01/12 0841)  GBS: Negative/-- (03/10 1140)  3 hr GTT: 83/145/91 Genetic screening:  NT/IT neg, Panorama: low risk female Anatomy US: Normal female  Prenatal Transfer Tool  Maternal Diabetes: No Genetic Screening: Normal Maternal Ultrasounds/Referrals: Normal Fetal Ultrasounds or other Referrals:  None Maternal Substance Abuse:  No Significant Maternal Medications:  None Significant Maternal Lab Results: Group B Strep negative and Rh negative  Results for orders placed or performed during the hospital encounter of 04/04/20 (from the past 24 hour(s))  Protein / creatinine ratio, urine   Collection Time: 04/04/20 10:51 PM  Result Value Ref Range   Creatinine, Urine 296.95 mg/dL   Total Protein, Urine 26 mg/dL   Protein Creatinine Ratio 0.09 0.00 - 0.15 mg/mg[Cre]  CBC   Collection Time: 04/04/20 11:17 PM  Result Value Ref Range   WBC 18.0 (H) 4.0 - 10.5 K/uL   RBC 3.78 (L) 3.87 - 5.11 MIL/uL   Hemoglobin 11.1 (L) 12.0 - 15.0 g/dL   HCT 33.4 (L) 36.0 - 46.0 %   MCV 88.4 80.0 - 100.0 fL   MCH 29.4 26.0 - 34.0 pg   MCHC  33.2 30.0 - 36.0 g/dL   RDW 12.9 11.5 - 15.5 %   Platelets 193 150 - 400 K/uL   nRBC 0.0 0.0 - 0.2 %  Comprehensive metabolic panel   Collection Time: 04/04/20 11:17 PM  Result Value Ref Range   Sodium 135 135 - 145 mmol/L   Potassium 3.9 3.5 - 5.1 mmol/L   Chloride 108 98 - 111 mmol/L   CO2 17 (L) 22 - 32 mmol/L   Glucose, Bld 94 70 - 99 mg/dL   BUN 6 6 - 20 mg/dL   Creatinine, Ser 0.59 0.44 - 1.00 mg/dL   Calcium 8.9 8.9 - 10.3 mg/dL  Total Protein 6.3 (L) 6.5 - 8.1 g/dL   Albumin 3.0 (L) 3.5 - 5.0 g/dL   AST 20 15 - 41 U/L   ALT 11 0 - 44 U/L   Alkaline Phosphatase 161 (H) 38 - 126 U/L   Total Bilirubin 0.7 0.3 - 1.2 mg/dL   GFR, Estimated >60 >60 mL/min   Anion gap 10 5 - 15  Type and screen   Collection Time: 04/04/20 11:17 PM  Result Value Ref Range   ABO/RH(D) O NEG    Antibody Screen POS    Sample Expiration 04/07/2020,2359    Antibody Identification      PASSIVELY ACQUIRED ANTI-D Performed at [facility], [place] [address] [place], [place] [address]     Patient Active Problem List   Diagnosis Date Noted  . Gestational hypertension 04/04/2020  . Chlamydia 10/10/2019  . Rh negative state in antepartum period 10/04/2019  . Supervision of normal first pregnancy 10/03/2019  . Multiple nevi 07/26/2016  . Insomnia 02/29/2016  . Migraine without aura and without status migrainosus, not intractable 01/23/2016  . Gastroesophageal reflux disease without esophagitis 01/18/2016  . Chronic gastritis without bleeding 01/18/2016  . Episodic tension-type headache, not intractable 05/14/2015  . Anxiety 03/31/2015  . Allergic rhinitis 04/17/2013  . Anemia 03/02/2013    Assessment/Plan:  Samantha Patterson is a 20 y.o. G1P0 at [redacted]w[redacted]d by L/8 here for IOL due to [place].  #Labor: Will give pitocin for augmentation of labor. #Pain: Well-controlled. Considering epidural placement in active labor. #FWB: Category 1 #ID: GBS negative #MOF: Breast #MOC: Depo #High bp:  Downtrending, will continue to monitor. PreE labs normal. #Headache: Possibly related to her history of migraines and tension headaches. #SOB: PreE labs normal. Lowers suspicion of pulmonary edema.  Rennis Chris, Medical Student  04/05/2020, 1:01 AM   CNM attestation:  I have seen and examined this patient; I agree with above documentation in the med student's note.   Samantha Patterson is a 20 y.o. G1P0 here for IOL due to new dx of gHTN when in MAU for labor eval.  PE: BP 128/78   Pulse (!) 116   Temp 98.3 F (36.8 C) (Oral)   Resp 18   Ht 5\' 8"  (1.727 m)   Wt 92.6 kg   LMP 07/05/2019   SpO2 100%   BMI 31.03 kg/m  Gen: feeling ctx as mild Resp: normal effort, no distress Abd: gravid  ROS, labs, PMH reviewed  Plan: Admit to Labor and Delivery Will augment ctx with Pitocin Watch BPs Declines Tylenol currently as H/A has gone away Anticipate vag del  [place] 04/05/2020, 3:15 AM

## 2020-04-05 ENCOUNTER — Encounter (HOSPITAL_COMMUNITY): Payer: Self-pay | Admitting: Obstetrics & Gynecology

## 2020-04-05 ENCOUNTER — Inpatient Hospital Stay (HOSPITAL_COMMUNITY): Payer: Medicaid Other | Admitting: Anesthesiology

## 2020-04-05 ENCOUNTER — Other Ambulatory Visit: Payer: Self-pay

## 2020-04-05 DIAGNOSIS — Z3A39 39 weeks gestation of pregnancy: Secondary | ICD-10-CM

## 2020-04-05 DIAGNOSIS — O134 Gestational [pregnancy-induced] hypertension without significant proteinuria, complicating childbirth: Secondary | ICD-10-CM

## 2020-04-05 DIAGNOSIS — O326XX0 Maternal care for compound presentation, not applicable or unspecified: Secondary | ICD-10-CM

## 2020-04-05 LAB — TYPE AND SCREEN
ABO/RH(D): O NEG
Antibody Screen: POSITIVE

## 2020-04-05 LAB — CBC
HCT: 32 % — ABNORMAL LOW (ref 36.0–46.0)
HCT: 31.7 % — ABNORMAL LOW (ref 36.0–46.0)
Hemoglobin: 11 g/dL — ABNORMAL LOW (ref 12.0–15.0)
Hemoglobin: 10.5 g/dL — ABNORMAL LOW (ref 12.0–15.0)
MCH: 29.8 pg (ref 26.0–34.0)
MCH: 29.2 pg (ref 26.0–34.0)
MCHC: 34.4 g/dL (ref 30.0–36.0)
MCHC: 33.1 g/dL (ref 30.0–36.0)
MCV: 86.7 fL (ref 80.0–100.0)
MCV: 88.1 fL (ref 80.0–100.0)
Platelets: 179 10*3/uL (ref 150–400)
Platelets: 183 10*3/uL (ref 150–400)
RBC: 3.69 MIL/uL — ABNORMAL LOW (ref 3.87–5.11)
RBC: 3.6 MIL/uL — ABNORMAL LOW (ref 3.87–5.11)
RDW: 13 % (ref 11.5–15.5)
RDW: 12.9 % (ref 11.5–15.5)
WBC: 18 10*3/uL — ABNORMAL HIGH (ref 4.0–10.5)
WBC: 19.2 10*3/uL — ABNORMAL HIGH (ref 4.0–10.5)
nRBC: 0 % (ref 0.0–0.2)
nRBC: 0 % (ref 0.0–0.2)

## 2020-04-05 LAB — PROTEIN / CREATININE RATIO, URINE
Creatinine, Urine: 296.95 mg/dL
Protein Creatinine Ratio: 0.09 mg/mg{Cre} (ref 0.00–0.15)
Total Protein, Urine: 26 mg/dL

## 2020-04-05 LAB — RPR: RPR Ser Ql: NONREACTIVE

## 2020-04-05 MED ORDER — ZOLPIDEM TARTRATE 5 MG PO TABS: 5.0000 mg | ORAL_TABLET | Freq: Every evening | ORAL | Status: DC | PRN

## 2020-04-05 MED ORDER — SIMETHICONE 80 MG PO CHEW: 80.0000 mg | CHEWABLE_TABLET | ORAL | Status: DC | PRN

## 2020-04-05 MED ORDER — MEDROXYPROGESTERONE ACETATE 150 MG/ML IM SUSP
150.0000 mg | INTRAMUSCULAR | Status: AC | PRN
  Administered 2020-04-07: 150 mg via INTRAMUSCULAR
  Filled 2020-04-05: qty 1

## 2020-04-05 MED ORDER — ACETAMINOPHEN 325 MG PO TABS
650.0000 mg | ORAL_TABLET | ORAL | Status: DC | PRN
  Administered 2020-04-07: 650 mg via ORAL
  Filled 2020-04-05: qty 2

## 2020-04-05 MED ORDER — SENNOSIDES-DOCUSATE SODIUM 8.6-50 MG PO TABS
2.0000 | ORAL_TABLET | Freq: Every day | ORAL | Status: DC
  Administered 2020-04-06 – 2020-04-07 (×2): 2 via ORAL
  Filled 2020-04-05 (×2): qty 2

## 2020-04-05 MED ORDER — ONDANSETRON HCL 4 MG PO TABS: 4.0000 mg | ORAL_TABLET | ORAL | Status: DC | PRN

## 2020-04-05 MED ORDER — DIBUCAINE (PERIANAL) 1 % EX OINT: 1.0000 "application " | TOPICAL_OINTMENT | CUTANEOUS | Status: DC | PRN

## 2020-04-05 MED ORDER — COCONUT OIL OIL
1.0000 "application " | TOPICAL_OIL | Status: DC | PRN
  Administered 2020-04-07: 1 via TOPICAL

## 2020-04-05 MED ORDER — FENTANYL-BUPIVACAINE-NACL 0.5-0.125-0.9 MG/250ML-% EP SOLN
12.0000 mL/h | EPIDURAL | Status: DC | PRN
  Administered 2020-04-05: 12 mL/h via EPIDURAL
  Filled 2020-04-05: qty 250

## 2020-04-05 MED ORDER — TERBUTALINE SULFATE 1 MG/ML IJ SOLN: 0.2500 mg | Freq: Once | INTRAMUSCULAR | Status: DC | PRN

## 2020-04-05 MED ORDER — IBUPROFEN 600 MG PO TABS
600.0000 mg | ORAL_TABLET | Freq: Four times a day (QID) | ORAL | Status: DC
  Administered 2020-04-05 – 2020-04-07 (×6): 600 mg via ORAL
  Filled 2020-04-05 (×6): qty 1

## 2020-04-05 MED ORDER — PHENYLEPHRINE 40 MCG/ML (10ML) SYRINGE FOR IV PUSH (FOR BLOOD PRESSURE SUPPORT)
80.0000 ug | PREFILLED_SYRINGE | INTRAVENOUS | Status: DC | PRN
  Filled 2020-04-05: qty 10

## 2020-04-05 MED ORDER — PHENYLEPHRINE 40 MCG/ML (10ML) SYRINGE FOR IV PUSH (FOR BLOOD PRESSURE SUPPORT): 80.0000 ug | PREFILLED_SYRINGE | INTRAVENOUS | Status: DC | PRN

## 2020-04-05 MED ORDER — LIDOCAINE HCL (PF) 1 % IJ SOLN
INTRAMUSCULAR | Status: DC | PRN
  Administered 2020-04-05: 11 mL via EPIDURAL

## 2020-04-05 MED ORDER — OXYTOCIN-SODIUM CHLORIDE 30-0.9 UT/500ML-% IV SOLN
1.0000 m[IU]/min | INTRAVENOUS | Status: DC
  Administered 2020-04-05: 2 m[IU]/min via INTRAVENOUS
  Filled 2020-04-05: qty 500

## 2020-04-05 MED ORDER — EPHEDRINE 5 MG/ML INJ: 10.0000 mg | INTRAVENOUS | Status: DC | PRN

## 2020-04-05 MED ORDER — TETANUS-DIPHTH-ACELL PERTUSSIS 5-2.5-18.5 LF-MCG/0.5 IM SUSY: 0.5000 mL | PREFILLED_SYRINGE | Freq: Once | INTRAMUSCULAR | Status: DC

## 2020-04-05 MED ORDER — DIPHENHYDRAMINE HCL 25 MG PO CAPS: 25.0000 mg | ORAL_CAPSULE | Freq: Four times a day (QID) | ORAL | Status: DC | PRN

## 2020-04-05 MED ORDER — BENZOCAINE-MENTHOL 20-0.5 % EX AERO: 1.0000 "application " | INHALATION_SPRAY | CUTANEOUS | Status: DC | PRN

## 2020-04-05 MED ORDER — FERROUS SULFATE 325 (65 FE) MG PO TABS
325.0000 mg | ORAL_TABLET | ORAL | Status: DC
  Administered 2020-04-06: 325 mg via ORAL
  Filled 2020-04-05: qty 1

## 2020-04-05 MED ORDER — DIPHENHYDRAMINE HCL 50 MG/ML IJ SOLN: 12.5000 mg | INTRAMUSCULAR | Status: DC | PRN

## 2020-04-05 MED ORDER — PRENATAL MULTIVITAMIN CH
1.0000 | ORAL_TABLET | Freq: Every day | ORAL | Status: DC
  Administered 2020-04-06 – 2020-04-07 (×2): 1 via ORAL
  Filled 2020-04-05 (×2): qty 1

## 2020-04-05 MED ORDER — ONDANSETRON HCL 4 MG/2ML IJ SOLN: 4.0000 mg | INTRAMUSCULAR | Status: DC | PRN

## 2020-04-05 MED ORDER — WITCH HAZEL-GLYCERIN EX PADS: 1.0000 "application " | MEDICATED_PAD | CUTANEOUS | Status: DC | PRN

## 2020-04-05 MED ORDER — LACTATED RINGERS IV SOLN: 500.0000 mL | Freq: Once | INTRAVENOUS | Status: DC

## 2020-04-05 NOTE — Progress Notes (Signed)
Labor Progress Note Samantha Patterson is a 20 y.o. G1P0 at 110w2d presented for IOL for gHTN  S:  Comfortable with epidural.   O:  BP 113/67   Pulse (!) 109   Temp 98.3 F (36.8 C) (Oral)   Resp 16   Ht 5\' 8"  (1.727 m)   Wt 92.6 kg   LMP 07/05/2019   SpO2 100%   BMI 31.03 kg/m  EFM: baseline 150 bpm/ mod variability/ no accels/ occ variable decels  Toco/IUPC: indeterminable SVE: Dilation: 5 Effacement (%): 80 Station: -2 Presentation: Vertex Exam by:: Shamyra Farias Pitocin: 10 mu/min  A/P: 20 y.o. G1P0 [redacted]w[redacted]d  1. Labor: active, slow progress 2. FWB: Cat II 3. Pain: epidural 4. GHTN: stable  Consented for SROM, SROM for pink tinged fluid, IUPC placed, titrate Pitocin as appropriate. Anticipate labor progress and SVD.  Julianne Handler, CNM 10:57 AM

## 2020-04-05 NOTE — Lactation Note (Signed)
This note was copied from a baby's chart. Lactation Consultation Note  Patient Name: Girl Genita Nilsson [id number] Date: 04/05/2020 Reason for consult: L&D Initial assessment;Primapara;1st time breastfeeding;Term;Other (Comment) (baby seen by [facility] in LD at 60 mins of birth, baby STS on moms chest , alert and calm, after a few minutes baby rooting and LC assisted to position the baby near the nipple, baby mouthing nipple at 1st. LC assisted and she suckled for a few sucks and off.) Age:21 mins   Maternal Data Has patient been taught Hand Expression?: Yes (per mom + breast changes with pregnancy) Does the patient have breastfeeding experience prior to this delivery?: No  Feeding Mother's Current Feeding Choice: Breast Milk  LATCH Score -  On and off latch x 2 and spoon fed  Latch: Repeated attempts needed to sustain latch, nipple held in mouth throughout feeding, stimulation needed to elicit sucking reflex.  Audible Swallowing: A few with stimulation  Type of Nipple: Everted at rest and after stimulation  Comfort (Breast/Nipple): Soft / non-tender  Hold (Positioning): Assistance needed to correctly position infant at breast and maintain latch.  LATCH Score: 7   Lactation Tools Discussed/Used    Interventions Interventions: Breast feeding basics reviewed  Discharge    Consult Status Consult Status: Follow-up Date: 04/05/20 Follow-up type: In-patient    [place] 04/05/2020, 6:15 PM

## 2020-04-05 NOTE — Progress Notes (Signed)
Patient ID: Samantha Patterson, female   DOB: [birth year], 20 y.o.   MRN: [id number]  Sitting on edge of bed, breathing with ctx; mild range BP elevations overnight with one value 142/110 and repeat 123/86  BP 122/78, P 102 FHR 150s, +accels, no decels Ctx 2-4 mins with Pit at 52mu/min Cx deferred  IUP@39 .2wks gHTN Latent labor  Continue to keep ctx reg with Pitocin; plan for exam in the next 1+hrs Anticipate vag del  Myrtis Ser [facility] 04/05/2020 7:58 AM

## 2020-04-05 NOTE — Anesthesia Preprocedure Evaluation (Signed)
Anesthesia Evaluation  Patient identified by MRN, date of birth, ID band Patient awake    Reviewed: Allergy & Precautions, NPO status , Patient's Chart, lab work & pertinent test results  Airway Mallampati: II  TM Distance: >3 FB Neck ROM: Full    Dental no notable dental hx.    Pulmonary neg pulmonary ROS,    Pulmonary exam normal breath sounds clear to auscultation       Cardiovascular hypertension, Normal cardiovascular exam Rhythm:Regular Rate:Normal     Neuro/Psych  Headaches, negative psych ROS   GI/Hepatic Neg liver ROS, GERD  ,  Endo/Other  negative endocrine ROS  Renal/GU negative Renal ROS  negative genitourinary   Musculoskeletal negative musculoskeletal ROS (+)   Abdominal   Peds negative pediatric ROS (+)  Hematology negative hematology ROS (+)   Anesthesia Other Findings   Reproductive/Obstetrics (+) Pregnancy                             Anesthesia Physical Anesthesia Plan  ASA: II  Anesthesia Plan: Epidural   Post-op Pain Management:    Induction:   PONV Risk Score and Plan:   Airway Management Planned:   Additional Equipment:   Intra-op Plan:   Post-operative Plan:   Informed Consent:   Plan Discussed with:   Anesthesia Plan Comments:         Anesthesia Quick Evaluation

## 2020-04-05 NOTE — Anesthesia Procedure Notes (Signed)
Epidural Patient location during procedure: OB Start time: 04/05/2020 8:43 AM End time: 04/05/2020 8:55 AM  Staffing Anesthesiologist: Lynda Rainwater, MD Performed: anesthesiologist   Preanesthetic Checklist Completed: patient identified, IV checked, site marked, risks and benefits discussed, surgical consent, monitors and equipment checked, pre-op evaluation and timeout performed  Epidural Patient position: sitting Prep: ChloraPrep Patient monitoring: heart rate, cardiac monitor, continuous pulse ox and blood pressure Approach: midline Location: L2-L3 Injection technique: LOR saline  Needle:  Needle type: Tuohy  Needle gauge: 17 G Needle length: 9 cm Needle insertion depth: 6 cm Catheter type: closed end flexible Catheter size: 20 Guage Catheter at skin depth: 10 cm Test dose: negative  Assessment Events: blood not aspirated, injection not painful, no injection resistance, no paresthesia and negative IV test  Additional Notes Epidural placed by SRNA under direct supervisionReason for block:procedure for pain

## 2020-04-05 NOTE — Discharge Summary (Signed)
Postpartum Discharge Summary     Patient Name: Samantha Patterson DOB: 05/26/2000 MRN: [id number]  Date of admission: 04/04/2020 Delivery date:04/05/2020  Delivering provider: Julianne Handler  Date of discharge: 04/07/2020  Admitting diagnosis: Gestational hypertension [O13.9] Intrauterine pregnancy: [redacted]w[redacted]d    Secondary diagnosis:  Active Problems:   Anxiety   Episodic tension-type headache, not intractable   Rh negative state in antepartum period   Gestational hypertension   SVD (spontaneous vaginal delivery)  Additional problems: none    Discharge diagnosis: Term Pregnancy Delivered                                              Post partum procedures:rhogam not indicated Augmentation: AROM and Pitocin Complications: None  Hospital course: Induction of Labor With Vaginal Delivery   20y.o. G1P0 at 310w2das admitted to the hospital 04/04/2020 for induction of labor.  Indication for induction: Gestational hypertension.  Patient had an uncomplicated labor course as follows: Membrane Rupture Time/Date: 10:52 AM ,04/05/2020   Delivery Method:Vaginal, Spontaneous  Episiotomy: None  Lacerations:  None  Details of delivery can be found in separate delivery note.  Patient had a routine postpartum course. She is ambulating, tolerating a diet, and voiding without difficulty. BP has been wnl. Patient is discharged home 04/07/20.  Newborn Data: Birth date:04/05/2020  Birth time:4:31 PM  Gender:Female  Living status:Living  Apgars:9 ,9  Weight:3657 g   Magnesium Sulfate received: No BMZ received: No Rhophylac:No MMR:No T-DaP:Declined Flu: Declined Transfusion:No  Physical exam  Vitals:   04/06/20 0450 04/06/20 1000 04/06/20 1415 04/07/20 0520  BP: 124/81 110/70 118/75 114/61  Pulse: 91 87 90 75  Resp: _0 Temp: 98.8 F (37.1 C) 97.8 F (36.6 C) 98.7 F (37.1 C) 98.6 F (37 C)  TempSrc: Oral Oral Oral Oral  SpO2: 99% 99% 99% 98%  Weight:      Height:        General: alert, cooperative and no distress Lochia: appropriate Uterine Fundus: firm Incision: N/A DVT Evaluation: No evidence of DVT seen on physical exam. Labs: Lab Results  Component Value Date   WBC 19.2 (H) 04/05/2020   HGB 10.5 (L) 04/05/2020   HCT 31.7 (L) 04/05/2020   MCV 88.1 04/05/2020   PLT 183 04/05/2020   CMP Latest Ref Rng & Units 04/04/2020  Glucose 70 - 99 mg/dL 94  BUN 6 - 20 mg/dL 6  Creatinine 0.44 - 1.00 mg/dL 0.59  Sodium 135 - 145 mmol/L 135  Potassium 3.5 - 5.1 mmol/L 3.9  Chloride 98 - 111 mmol/L 108  CO2 22 - 32 mmol/L 17(L)  Calcium 8.9 - 10.3 mg/dL 8.9  Total Protein 6.5 - 8.1 g/dL 6.3(L)  Total Bilirubin 0.3 - 1.2 mg/dL 0.7  Alkaline Phos 38 - 126 U/L 161(H)  AST 15 - 41 U/L 20  ALT 0 - 44 U/L 11   Edinburgh Score: Edinburgh Postnatal Depression Scale Screening Tool 04/06/2020  I have been able to laugh and see the funny side of things. 1  I have looked forward with enjoyment to things. 1  I have blamed myself unnecessarily when things went wrong. 2  I have been anxious or worried for no good reason. 2  I have felt scared or panicky for no good reason. 2  Things have been getting on top of me. 2  I have been so unhappy that I have had difficulty sleeping. 1  I have felt sad or miserable. 2  I have been so unhappy that I have been crying. 1  The thought of harming myself has occurred to me. 0  Edinburgh Postnatal Depression Scale Total 14     After visit meds:  Allergies as of 04/07/2020   No Known Allergies     Medication List    STOP taking these medications   pantoprazole 20 MG tablet Commonly known as: Protonix     TAKE these medications   acetaminophen 325 MG tablet Commonly known as: TYLENOL Take 650 mg by mouth every 6 (six) hours as needed for mild pain or headache.   coconut oil Oil Apply 1 application topically as needed.   ferrous sulfate 325 (65 FE) MG tablet Take 1 tablet (325 mg total) by mouth every other  day. Start taking on: April 08, 2020   ibuprofen 600 MG tablet Commonly known as: ADVIL Take 1 tablet (600 mg total) by mouth every 6 (six) hours.        Discharge home in stable condition Infant Feeding: Breast Infant Disposition:home with mother Discharge instruction: per After Visit Summary and Postpartum booklet. Activity: Advance as tolerated. Pelvic rest for 6 weeks.  Diet: routine diet Future Appointments: Future Appointments  Date Time Provider [place]  04/12/2020 10:10 AM [facility] NURSE [facility]  05/10/2020 11:50 AM [name], CNM [facility]   Follow up Visit:  Please schedule this patient for a Virtual postpartum visit in 6 weeks with the following provider: Any provider. Additional Postpartum F/U:BP check 1 week with mood check High risk pregnancy complicated by: HTN Delivery mode:  Vaginal, Spontaneous  Anticipated Birth Control:  PP Depo given   04/07/2020 Alcus Dad, MD  DISCHARGE ATTESTATION  I have seen and examined this patient and agree with above documentation in the resident's note.   Darlina Rumpf, CNM 04/07/2020

## 2020-04-06 NOTE — Lactation Note (Signed)
This note was copied from a baby's chart. Lactation Consultation Note  Patient Name: Samantha Patterson [id number] Date: 04/06/2020 Reason for consult: 1st time breastfeeding;Term;Primapara Age:21 hours   P1 mother whose infant is now 75 hours old.  This is a term baby at 39+2 weeks.  RN requested latch assistance.  Mother had baby latched to the right breast in the cross cradle hold when I arrived.  Questionable latch; asked mother to remove baby from the breast and latch again.  When mother removed baby her nipple was pinched.  Explained to mother that the baby was not latched correctly.  Breast feeding basics reviewed and assisted to latch more deeply.  Mother was able to observe a deeper latch; no pain noted.  Continued to observe baby feeding for about 10 minutes before becoming sleepy.  Placed her STS on mother's chest and she began awakening and showing cues.  Latched again and baby was still feeding with stimulation when I exited the room after another 5 minutes of feeding.  Encouraged mother to call her Rn/LC for latch assistance as needed.  Mother has breast shells and a hand pump to use as needed.  Her nipples are not flat, but short shafted.  Mother appreciative.   Maternal Data    Feeding Mother's Current Feeding Choice: Breast Milk  LATCH Score Latch: Repeated attempts needed to sustain latch, nipple held in mouth throughout feeding, stimulation needed to elicit sucking reflex.  Audible Swallowing: None  Type of Nipple: Everted at rest and after stimulation  Comfort (Breast/Nipple): Soft / non-tender  Hold (Positioning): Assistance needed to correctly position infant at breast and maintain latch.  LATCH Score: 6   Lactation Tools Discussed/Used    Interventions Interventions: Breast feeding basics reviewed;Assisted with latch;Skin to skin;Breast massage;Hand express;Breast compression;Adjust position;Position options;Support pillows;Education  Discharge     Consult Status Consult Status: Follow-up Date: 04/07/20 Follow-up type: In-patient    [name] 04/06/2020, 12:54 PM

## 2020-04-06 NOTE — Progress Notes (Addendum)
POSTPARTUM PROGRESS NOTE  Subjective: Samantha Patterson is a 20 y.o. G1P1001 on PPD#1 s/p SVD at [redacted]w[redacted]d.  She reports she's doing well. No acute events overnight. She denies any problems with ambulating, voiding or po intake. Denies nausea or vomiting. Pain is well controlled.  Lochia is appropriate.  Objective: Blood pressure 124/81, pulse 91, temperature 98.8 F (37.1 C), temperature source Oral, resp. rate 18, height 5\' 8"  (1.727 m), weight 92.6 kg, last menstrual period 07/05/2019, SpO2 99 %, unknown if currently breastfeeding.  Physical Exam:  General: alert, cooperative and no distress Chest: no respiratory distress Abdomen: soft, appropriately tender Uterine Fundus: firm and at level of umbilicus Extremities: No calf swelling or tenderness  Recent Labs    04/05/20 0724 04/05/20 1714  HGB 11.0* 10.5*  HCT 32.0* 31.7*    Assessment/Plan: Samantha Patterson is a 20 y.o. G1P1001 on PPD#1 s/p SVD at [redacted]w[redacted]d for gHTN.  Routine Postpartum Care: Doing well, pain well-controlled.  BP has been wnl (120s/80s). -- Continue routine care -- Contraception: Plan for depo prior to discharge -- Feeding: Breast, lactation support prn Anxiety: SW consult Rh neg: Rhogam eval  Dispo: Plan for discharge tomorrow morning.  Alcus Dad, MD PGY-1 Family Medicine 04/06/2020 7:26 AM

## 2020-04-06 NOTE — Progress Notes (Signed)
MOB was referred for history of anxiety. * Referral screened out by Clinical Social Worker because none of the following criteria appear to apply: ~ History of anxiety during this pregnancy, or of post-partum depression following prior delivery. Per prenatal records, "h/o anxiety>no meds, doing well" no concerns noted.  ~ Diagnosis of anxiety within last 3 years. Per chart review, MOB's anxiety dates back to 2017.  OR * MOB's symptoms currently being treated with medication and/or therapy. Please contact the Clinical Social Worker if needs arise, by [facility] request, or if MOB scores greater than 9/yes to question 10 on Edinburgh Postpartum Depression Screen.  Abundio Miu, [place] Worker [facility] Cell#: [phone]

## 2020-04-06 NOTE — Lactation Note (Signed)
This note was copied from a baby's chart. Lactation Consultation Note Baby 42 hrs old. Baby sleeping in FOB arms. FOB very sleepy, grandmother very sleepy, mom looks exhausted but awake. [place] asked if LC could swaddle baby and place in bassinet so they can sleep, FOB stated yes please. While [place] talking w/mom, baby making  Noises, noted baby spitting up clear fluid. LC used bulb syring to suction mouth. FOB stated its scary. Baby got choked earlier and they are scared it will happen again and she's making those noises you can't sleep.  Talking w/mom about newborn behavior and feeding habits. Encouraged everyone to rest or at least take turns.  Asked mom for LC to assess breast tissue and demonstrate hand expression.  Mom is flat but compressible. Colostrum noted. LC gave mom hand pump for pre-pumping and shells.  Lactation brochure given. Encouraged to call for [facility] for next feeding.  Patient Name: Samantha Patterson [id number] Date: 04/06/2020 Reason for consult: Initial assessment;Primapara;Term Age:21 hours  Maternal Data Has patient been taught Hand Expression?: Yes Does the patient have breastfeeding experience prior to this delivery?: No  Feeding    LATCH Score       Type of Nipple: Flat  Comfort (Breast/Nipple): Soft / non-tender         Lactation Tools Discussed/Used Tools: Shells;Pump Breast pump type: Manual Pump Education: Setup, frequency, and cleaning Reason for Pumping: evert nipples/pre-pumping  Interventions Interventions: Hand express;Hand pump;Shells  Discharge [facility] Program: Yes  Consult Status Consult Status: Follow-up Date: 04/06/20 Follow-up type: In-patient    Theodoro Kalata 04/06/2020, 1:53 AM

## 2020-04-06 NOTE — Anesthesia Postprocedure Evaluation (Signed)
Anesthesia Post Note  Patient: Samantha Patterson  Procedure(s) Performed: AN AD HOC LABOR EPIDURAL     Patient location during evaluation: Mother Baby Anesthesia Type: Epidural Level of consciousness: awake Pain management: satisfactory to patient Vital Signs Assessment: post-procedure vital signs reviewed and stable Respiratory status: spontaneous breathing Cardiovascular status: stable Anesthetic complications: no   No complications documented.  Last Vitals:  Vitals:   04/06/20 0030 04/06/20 0450  BP: 126/80 124/81  Pulse: (!) 109 91  Resp: 18 18  Temp: 36.9 C 37.1 C  SpO2: 98% 99%    Last Pain:  Vitals:   04/06/20 0450  TempSrc: Oral  PainSc: 0-No pain   Pain Goal: Patients Stated Pain Goal: 3 (04/05/20 1953)                 Casimer Lanius

## 2020-04-07 MED ORDER — IBUPROFEN 600 MG PO TABS: 600.0000 mg | ORAL_TABLET | Freq: Four times a day (QID) | ORAL | Status: DC

## 2020-04-07 MED ORDER — FERROUS SULFATE 325 (65 FE) MG PO TABS: 325.0000 mg | ORAL_TABLET | ORAL | Status: DC

## 2020-04-07 MED ORDER — COCONUT OIL OIL: 1.0000 "application " | TOPICAL_OIL | 0 refills | Status: DC | PRN

## 2020-04-07 NOTE — Progress Notes (Signed)
CSW received consult due to score 14 on Edinburgh Depression Screen.    CSW met with MOB at bedside, FOB present. MOB was breast feeding infant. CSW introduced self and asked to speak with MOB privately, FOB agreed and left the room. CSW explained reason for consult. MOB presented with a flat affect and remained engaged during assessment. CSW and MOB discussed edinburgh score 14. MOB shared that initially she struggled when finding out about pregnancy as she had not planned to have children. CSW acknowledged and validated MOB's experience. MOB reported that she was worried that she would not have a connection with her baby. CSW inquired about MOB's current attachment to infant. MOB reported that she feels attached and bonded with infant and loves infant. CSW inquired about MOB's mental health history. MOB reported that she was diagnosed with depression and anxiety in high school more than 3 years ago. MOB reported that she last had depressive symptoms the first couple months of pregnancy. MOB described her depression as isolating at times and having a lack of motivation. MOB reported that she last experienced anxiety symptoms on Wednesday when coming in to give birth. MOB reported that she is not taking any medication nor participating in therapy to treat her mental health diagnoses. MOB denied any current symptoms. CSW and MOB discussed MOB being more susceptible to postpartum depression due to her mental health history. MOB verbalized understanding and reported that she has done a lot of research on postpartum depression. CSW inquired about how MOB was feeling emotionally after giving birth, MOB reported that she was feeling fine. MOB presented calm and did not demonstrate any acute mental health signs/symptoms. CSW assessed for safety, MOB denied SI, HI and domestic violence. CSW inquired about MOB's support system, MOB reported that FOB and her parents are supports. MOB reported that they have all items needed  to care for infant including a car seat, basinet and crib.   CSW provided education regarding Baby Blues vs PMADs and provided MOB with resources for mental health follow up.  CSW encouraged MOB to evaluate her mental health throughout the postpartum period with the use of the New Mom Checklist developed by Postpartum Progress as well as the Edinburgh Postnatal Depression Scale and notify a medical professional if symptoms arise.    CSW provided review of Sudden Infant Death Syndrome (SIDS) precautions.    CSW identifies no further need for intervention and no barriers to discharge at this time.  Joyell Emami, LCSW Clinical Social Worker [facility] Cell#: [phone] 

## 2020-04-07 NOTE — Lactation Note (Signed)
This note was copied from a baby's chart. Lactation Consultation Note  Patient Name: Samantha Patterson Date: 04/07/2020   Age:21 hours  Parents report they feel she is breastfeeding well.  Mom reports she did not feed often last night like they expected.  About every 3 hours.  Reviewed hunger cues.  Urged to feed 8-12 or more times day based on cues.  Urged continued hand expression.  Mom asked about pumping.  Urged to wait until 3 weeks to introduce bottles but if mom wanted to do some occasional pumping past breastfeeds she can.  Urged her to feed that milk back with an alternate feeding method.  Discussed how to know a breastfed baby is getting enough.  Mom reports latch on soreness that subsides in about 30 seconds and that nipples are intact when infant lets go and round and elongated. Praised breastfeeding.  Mom has Breastfeeding Futures trader for home use.    Maternal Data    Feeding    LATCH Score                    Lactation Tools Discussed/Used    Interventions    Discharge    Consult Status      Samantha Patterson 04/07/2020, 9:28 AM

## 2020-04-11 ENCOUNTER — Other Ambulatory Visit: Payer: Medicaid Other | Admitting: Women's Health

## 2020-04-12 ENCOUNTER — Telehealth: Payer: Medicaid Other

## 2020-04-13 ENCOUNTER — Telehealth: Payer: Self-pay

## 2020-04-13 ENCOUNTER — Telehealth (INDEPENDENT_AMBULATORY_CARE_PROVIDER_SITE_OTHER): Payer: Medicaid Other

## 2020-04-13 ENCOUNTER — Other Ambulatory Visit: Payer: Self-pay

## 2020-04-13 DIAGNOSIS — O133 Gestational [pregnancy-induced] hypertension without significant proteinuria, third trimester: Secondary | ICD-10-CM

## 2020-05-01 NOTE — Telephone Encounter (Signed)
Pt called on 4/1, noted in chart.

## 2020-05-10 ENCOUNTER — Other Ambulatory Visit: Payer: Self-pay

## 2020-05-10 ENCOUNTER — Ambulatory Visit (INDEPENDENT_AMBULATORY_CARE_PROVIDER_SITE_OTHER): Payer: Medicaid Other | Admitting: Women's Health

## 2020-05-10 ENCOUNTER — Encounter: Payer: Self-pay | Admitting: Women's Health

## 2020-05-10 DIAGNOSIS — O99345 Other mental disorders complicating the puerperium: Secondary | ICD-10-CM | POA: Diagnosis not present

## 2020-05-10 DIAGNOSIS — Z30013 Encounter for initial prescription of injectable contraceptive: Secondary | ICD-10-CM | POA: Diagnosis not present

## 2020-05-10 DIAGNOSIS — F53 Postpartum depression: Secondary | ICD-10-CM

## 2020-05-10 DIAGNOSIS — F418 Other specified anxiety disorders: Secondary | ICD-10-CM

## 2020-05-10 MED ORDER — MEDROXYPROGESTERONE ACETATE 150 MG/ML IM SUSP: 150.0000 mg | INTRAMUSCULAR | 3 refills | Status: DC

## 2020-05-10 NOTE — Progress Notes (Signed)
POSTPARTUM VISIT Patient name: Samantha Patterson MRN [id number]  Date of birth: 06/05/2000 Chief Complaint:   Postpartum Care (Started Depo in hosp)  History of Present Illness:   Samantha Patterson is a 20 y.o. G27P1001 Caucasian female being seen today for a postpartum visit. She is 5 weeks postpartum following a spontaneous vaginal delivery at 39.1 gestational weeks. IOL: yes, for gestational hypertension  and n/a. Anesthesia: epidural.  Laceration: none.  Complications: none. Inpatient contraception: yes depo on 3/24.   Pregnancy complicated by GHTN. Tobacco use: no. Substance use disorder: no. Last pap smear: <21yo and results were N/A. Next pap smear due: $RemoveBe'@20yo'iULSvzhjd$  No LMP recorded.  Postpartum course has been uncomplicated. Bleeding scant staining. Bowel function is-some constipation. Bladder function is normal. Urinary incontinence? no, fecal incontinence? no Patient is not sexually active. Last sexual activity: prior to birth of baby. Desired contraception: PP Depo given. Patient does not know if wants a pregnancy in the future.  Desired family size is uncertain#of children.   The pregnancy intention screening data noted above was reviewed. Potential methods of contraception were discussed. The patient elected to proceed with Hormonal Injection.   Edinburgh Postpartum Depression Screening: positive: H/O mental health disorder: yes dep/anx in high school. Currently on meds: no.  Currently in therapy: no.  Sleeping: well.  Appetite: decreased.  Still finds joy in things she used to: Yes.  Support at home: having some issues w/ fiance.  SI/HI/II: no.  Interested in medicine: No.  Interested in therapy: Yes.  Edinburgh Postnatal Depression Scale - 05/10/20 1205      Edinburgh Postnatal Depression Scale:  In the Past 7 Days   I have been able to laugh and see the funny side of things. 1    I have looked forward with enjoyment to things. 2    I have blamed myself unnecessarily when things  went wrong. 2    I have been anxious or worried for no good reason. 3    I have felt scared or panicky for no good reason. 2    Things have been getting on top of me. 2    I have been so unhappy that I have had difficulty sleeping. 1    I have felt sad or miserable. 1    I have been so unhappy that I have been crying. 0    The thought of harming myself has occurred to me. 0    Edinburgh Postnatal Depression Scale Total 14          Baby's course has been uncomplicated. Baby is feeding by breast: milk supply adequate. Infant has a pediatrician/family doctor? Yes.  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  Pt has material needs met for her and baby: Yes.   Review of Systems:   Pertinent items are noted in HPI Denies Abnormal vaginal discharge w/ itching/odor/irritation, headaches, visual changes, shortness of breath, chest pain, abdominal pain, severe nausea/vomiting, or problems with urination or bowel movements. Pertinent History Reviewed:  Reviewed past medical,surgical, obstetrical and family history.  Reviewed problem list, medications and allergies. OB History  Gravida Para Term Preterm AB Living  $Remov'1 1 1     1  'FDxyiD$ SAB IAB Ectopic Multiple Live Births        0 1    # Outcome Date GA Lbr Len/2nd Weight Sex Delivery Anes PTL Lv  1 Term 04/05/20 [redacted]w[redacted]d 09:00 / 01:30 8 lb 1 oz (3.657 kg) F Vag-Spont EPI  LIV   Physical Assessment:   Vitals:   05/10/20 1203  BP: 120/84  Pulse: 84  Weight: 178 lb 8 oz (81 kg)  Height: $Remove'5\' 9"'JobQaKH$  (1.753 m)  Body mass index is 26.36 kg/m.       Physical Examination:   General appearance: alert, well appearing, and in no distress  Mental status: alert, oriented to person, place, and time  Skin: warm & dry   Cardiovascular: normal heart rate noted   Respiratory: normal respiratory effort, no distress   Breasts: deferred, no complaints   Abdomen: soft, non-tender   Pelvic: examination not indicated. Thin prep pap obtained:  No  Rectal: not examined  Extremities: Edema: none   Chaperone: N/A         No results found for this or any previous visit (from the past 24 hour(s)).  Assessment & Plan:  1) Postpartum exam 2) 5 wks s/p spontaneous vaginal delivery after IOL for GHTN 3) breast feeding 4) Depression screening 5) Contraception management: rx depo, schedule 2nd dose  6) PPD/anx> declines meds, IBH referral ordered  Essential components of care per ACOG recommendations:  1.  Mood and well being:  . If positive depression screen, discussed and plan developed.  . If using tobacco we discussed reduction/cessation and risk of relapse . If current substance abuse, we discussed and referral to local resources was offered.   2. Infant care and feeding:  . If breastfeeding, discussed returning to work, pumping, breastfeeding-associated pain, guidance regarding return to fertility while lactating if not using another method. If needed, patient was provided with a letter to be allowed to pump q 2-3hrs to support lactation in a private location with access to a refrigerator to store breastmilk.   . Recommended that all caregivers be immunized for flu, pertussis and other preventable communicable diseases . If pt does not have material needs met for her/baby, referred to local resources for help obtaining these.  3. Sexuality, contraception and birth spacing . Provided guidance regarding sexuality, management of dyspareunia, and resumption of intercourse . Discussed avoiding interpregnancy interval <61mths and recommended birth spacing of 18 months  4. Sleep and fatigue . Discussed coping options for fatigue and sleep disruption . Encouraged family/partner/community support of 4 hrs of uninterrupted sleep to help with mood and fatigue  5. Physical recovery  . If pt had a C/S, assessed incisional pain and providing guidance on normal vs prolonged recovery . If pt had a laceration, perineal healing and pain  reviewed.  . If urinary or fecal incontinence, discussed management and referred to PT or uro/gyn if indicated  . Patient is safe to resume physical activity. Discussed attainment of healthy weight.  6.  Chronic disease management . Discussed pregnancy complications if any, and their implications for future childbearing and long-term maternal health. . Review recommendations for prevention of recurrent pregnancy complications, such as 17 hydroxyprogesterone caproate to reduce risk for recurrent PTB not applicable, or aspirin to reduce risk of preeclampsia yes. . Pt had GDM: no. If yes, 2hr GTT scheduled: not applicable. . Reviewed medications and non-pregnant dosing including consideration of whether pt is breastfeeding using a reliable resource such as LactMed: yes . Referred for f/u w/ PCP or subspecialist providers as indicated: yes  7. Health maintenance . Mammogram at 21yo or earlier if indicated . Pap smears as indicated  Meds:  Meds ordered this encounter  Medications  . medroxyPROGESTERone (DEPO-PROVERA) 150 MG/ML injection    Sig: Inject 1 mL (150 mg total)  into the muscle every 3 (three) months.    Dispense:  1 mL    Refill:  3    Order Specific Question:   Supervising Provider    Answer:   Florian Buff [[id number]]    Follow-up: Return for schedule next depo dose (last on 3/24); then after 7/11 for pap & physical.   Orders Placed This Encounter  Procedures  . Amb ref to [place], [facility] 05/10/2020 12:36 PM

## 2020-05-10 NOTE — Patient Instructions (Signed)

## 2020-05-30 NOTE — BH Specialist Note (Signed)
Pt did not arrive to video visit and did not answer the phone ; Left HIPPA-compliant message to call back Daja Shuping from [facility] at [phone] (main office) or [phone] (Crosby Bevan's office).  ; left MyChart message for patient.      

## 2020-06-13 ENCOUNTER — Ambulatory Visit: Payer: Medicaid Other | Admitting: Clinical

## 2020-06-13 DIAGNOSIS — Z91199 Patient's noncompliance with other medical treatment and regimen due to unspecified reason: Secondary | ICD-10-CM

## 2020-06-13 DIAGNOSIS — Z5329 Procedure and treatment not carried out because of patient's decision for other reasons: Secondary | ICD-10-CM

## 2020-06-15 NOTE — Progress Notes (Signed)
Appt canceled!

## 2020-06-21 ENCOUNTER — Ambulatory Visit (INDEPENDENT_AMBULATORY_CARE_PROVIDER_SITE_OTHER): Payer: Medicaid Other

## 2020-06-21 ENCOUNTER — Other Ambulatory Visit: Payer: Self-pay

## 2020-06-21 DIAGNOSIS — Z3042 Encounter for surveillance of injectable contraceptive: Secondary | ICD-10-CM

## 2020-06-21 MED ORDER — MEDROXYPROGESTERONE ACETATE 150 MG/ML IM SUSP
150.0000 mg | Freq: Once | INTRAMUSCULAR | Status: AC
  Administered 2020-06-21: 150 mg via INTRAMUSCULAR

## 2020-06-21 NOTE — Progress Notes (Signed)
   NURSE VISIT- INJECTION  SUBJECTIVE:  Samantha Patterson is a 20 y.o. G74P1001 female here for a Depo Provera for contraception/period management. She is postpartum.   OBJECTIVE:  There were no vitals taken for this visit.  Appears well, in no apparent distress  Injection administered in: Right deltoid  Meds ordered this encounter  Medications   medroxyPROGESTERone (DEPO-PROVERA) injection 150 mg    ASSESSMENT: Postpartum Depo Provera for contraception/period management PLAN: Follow-up: in 11-13 weeks for next Depo   Marjorie Lussier A Ayrabella Labombard  06/21/2020 10:33 AM

## 2020-07-25 ENCOUNTER — Encounter: Payer: Self-pay | Admitting: Adult Health

## 2020-07-25 ENCOUNTER — Ambulatory Visit (INDEPENDENT_AMBULATORY_CARE_PROVIDER_SITE_OTHER): Payer: Medicaid Other | Admitting: Adult Health

## 2020-07-25 ENCOUNTER — Other Ambulatory Visit: Payer: Self-pay

## 2020-07-25 ENCOUNTER — Other Ambulatory Visit (HOSPITAL_COMMUNITY)
Admission: RE | Admit: 2020-07-25 | Discharge: 2020-07-25 | Disposition: A | Discharge status: Home / Self Care | Payer: Medicaid Other | Source: Ambulatory Visit | Attending: Adult Health | Admitting: Adult Health

## 2020-07-25 VITALS — BP 139/87 | HR 103 | Ht 69.0 in | Wt 175.0 lb

## 2020-07-25 DIAGNOSIS — Z01419 Encounter for gynecological examination (general) (routine) without abnormal findings: Secondary | ICD-10-CM

## 2020-07-25 DIAGNOSIS — Z309 Encounter for contraceptive management, unspecified: Secondary | ICD-10-CM | POA: Diagnosis not present

## 2020-07-25 NOTE — Progress Notes (Signed)
Patient ID: Samantha Patterson, female   DOB: 07/13/2000, 20 y.o.   MRN: [id number] History of Present Illness: Samantha Patterson is a  20 year old white female,single, G1P1 in for well woman gyn exam and first pap. She has a 80 month old daughter and is breastfeeding. PCP is [facility].   Current Medications, Allergies, Past Medical History, Past Surgical History, Family History and Social History were reviewed in [employer] record.     Review of Systems: Patient denies any headaches, hearing loss, fatigue, blurred vision, shortness of breath, chest pain, abdominal pain, problems with bowel movements, urination, or intercourse.(Has some pain with sex at times and decreased libido). No joint pain or mood swings.     Physical Exam:BP 139/87 (BP Location: Left Arm, Patient Position: Sitting, Cuff Size: Normal)   Pulse (!) 103   Ht 5\' 9"  (1.753 m)   Wt 175 lb (79.4 kg)   Breastfeeding Yes   BMI 25.84 kg/m   General:  Well developed, well nourished, no acute distress Skin:  Warm and dry Neck:  Midline trachea, normal thyroid, good ROM, no lymphadenopathy Lungs; Clear to auscultation bilaterally Breast:  No dominant palpable mass, retraction, or nipple discharge Cardiovascular: Regular rate and rhythm Abdomen:  Soft, non tender, no hepatosplenomegaly Pelvic:  External genitalia is normal in appearance, no lesions.  The vagina is normal in appearance. Urethra has no lesions or masses. The cervix is bulbous.Pap with GC/CHL performed.  Uterus is felt to be normal size, shape, and contour.  No adnexal masses or tenderness noted.Bladder is non tender, no masses felt. Rectal: Deferred. Extremities/musculoskeletal:  No swelling or varicosities noted, no clubbing or cyanosis Psych:  No mood changes, alert and cooperative,seems happy AA is 2 Fall risk is low Depression screen [facility] 2/9 07/25/2020 01/25/2020 10/03/2019  Decreased Interest 0 1 2  Down, Depressed, Hopeless 1 1 1   PHQ - 2  Score 1 2 3   Altered sleeping 1 1 2   Tired, decreased energy 1 1 2   Change in appetite 1 1 2   Feeling bad or failure about yourself  0 1 2  Trouble concentrating 0 1 2  Moving slowly or fidgety/restless 0 1 0  Suicidal thoughts 0 0 0  PHQ-9 Score 4 8 13     GAD 7 : Generalized Anxiety Score 07/25/2020 01/25/2020 10/03/2019  Nervous, Anxious, on Edge 1 1 0  Control/stop worrying 1 0 0  Worry too much - different things 1 1 0  Trouble relaxing 1 1 1   Restless 0 0 0  Easily annoyed or irritable 1 1 1   Afraid - awful might happen 0 1 0  Total GAD 7 Score 5 5 2       Upstream - 07/25/20 1059       Pregnancy Intention Screening   Does the patient want to become pregnant in the next year? No    Does the patient's partner want to become pregnant in the next year? No    Would the patient like to discuss contraceptive options today? No      Contraception Wrap Up   Current Method Hormonal Injection    End Method Hormonal Injection    Contraception Counseling Provided No            Pt gave verbal consent for exam without chaperone.   Impression and Plan: 1. Encounter for physical examination, contraception, and Papanicolaou smear of cervix Pap sent Physical in 1 year Pap in 3 if normal Has refills on depo  Discussed increasing foreplay

## 2020-07-25 NOTE — Addendum Note (Signed)
Addended by: Diona Fanti A on: 07/25/2020 11:29 AM   Modules accepted: Orders

## 2020-07-25 NOTE — Addendum Note (Signed)
Addended by: Diona Fanti A on: 07/25/2020 11:36 AM   Modules accepted: Orders

## 2020-07-30 LAB — CYTOLOGY - PAP
Chlamydia: NEGATIVE
Comment: NEGATIVE
Comment: NORMAL
Diagnosis: NEGATIVE
Neisseria Gonorrhea: NEGATIVE

## 2020-09-12 ENCOUNTER — Other Ambulatory Visit: Payer: Self-pay

## 2020-09-12 ENCOUNTER — Ambulatory Visit (INDEPENDENT_AMBULATORY_CARE_PROVIDER_SITE_OTHER): Payer: Medicaid Other

## 2020-09-12 DIAGNOSIS — Z3042 Encounter for surveillance of injectable contraceptive: Secondary | ICD-10-CM | POA: Diagnosis not present

## 2020-09-12 MED ORDER — MEDROXYPROGESTERONE ACETATE 150 MG/ML IM SUSP
150.0000 mg | Freq: Once | INTRAMUSCULAR | Status: AC
  Administered 2020-09-12: 150 mg via INTRAMUSCULAR

## 2020-09-12 NOTE — Progress Notes (Signed)
   NURSE VISIT- INJECTION  SUBJECTIVE:  Samantha Patterson is a 20 y.o. G42P1001 female here for a Depo Provera for contraception/period management. She is postpartum.   OBJECTIVE:  There were no vitals taken for this visit.  Appears well, in no apparent distress  Injection administered in: Left deltoid  Meds ordered this encounter  Medications   medroxyPROGESTERone (DEPO-PROVERA) injection 150 mg    ASSESSMENT: Postpartum Depo Provera for contraception/period management PLAN: Follow-up: in 11-13 weeks for next Depo   Sebastian Dzik A Corderro Koloski  09/12/2020 11:41 AM

## 2020-09-13 ENCOUNTER — Telehealth: Payer: Self-pay | Admitting: Family Medicine

## 2020-09-13 ENCOUNTER — Ambulatory Visit: Payer: Medicaid Other

## 2020-09-13 MED ORDER — PERMETHRIN 5 % EX CREA: TOPICAL_CREAM | CUTANEOUS | 1 refills | Status: DC

## 2020-09-13 NOTE — Telephone Encounter (Signed)
Pls send in elimite 5%  Apply from neck to toes topically at night, rinse off 8 hrs later. Repeat in 1 wk.  1 bottle with 1 refill.   D.r taylor

## 2020-09-13 NOTE — Telephone Encounter (Signed)
Prescription sent electronically to pharmacy. Patient notified. 

## 2020-09-13 NOTE — Telephone Encounter (Signed)
Patient is requesting something for scabies be called into [facility] She was last seen 02/25/19

## 2020-11-28 ENCOUNTER — Encounter: Payer: Self-pay | Admitting: Family Medicine

## 2020-11-28 ENCOUNTER — Other Ambulatory Visit: Payer: Self-pay

## 2020-11-28 ENCOUNTER — Ambulatory Visit (INDEPENDENT_AMBULATORY_CARE_PROVIDER_SITE_OTHER): Payer: Medicaid Other | Admitting: Family Medicine

## 2020-11-28 VITALS — BP 130/84 | HR 132 | Temp 97.3°F | Wt 169.2 lb

## 2020-11-28 DIAGNOSIS — Z13220 Encounter for screening for lipoid disorders: Secondary | ICD-10-CM | POA: Diagnosis not present

## 2020-11-28 DIAGNOSIS — G43009 Migraine without aura, not intractable, without status migrainosus: Secondary | ICD-10-CM | POA: Diagnosis not present

## 2020-11-28 DIAGNOSIS — D649 Anemia, unspecified: Secondary | ICD-10-CM

## 2020-11-28 DIAGNOSIS — R030 Elevated blood-pressure reading, without diagnosis of hypertension: Secondary | ICD-10-CM

## 2020-11-28 MED ORDER — PROMETHAZINE HCL 25 MG PO TABS: 25.0000 mg | ORAL_TABLET | Freq: Three times a day (TID) | ORAL | 1 refills | Status: AC | PRN

## 2020-11-28 NOTE — Assessment & Plan Note (Signed)
No reported heavy menstrual bleeding.  Will obtain CBC.

## 2020-11-28 NOTE — Assessment & Plan Note (Signed)
Uncontrolled.  Therapeutic options limited in the setting of breast-feeding.  Phenergan as needed.  Advised Tylenol and ibuprofen.

## 2020-11-28 NOTE — Patient Instructions (Signed)
Tylenol 1000 mg 3 times daily as needed.  Ibuprofen 600 - 800 mg every 8 hours as needed.  Use the phenergan if needed.  Labs at your convenience.  Take care  Dr. Lacinda Axon

## 2020-11-28 NOTE — Progress Notes (Signed)
Subjective:  Patient ID: Samantha Patterson, female    DOB: 2000/11/03  Age: 20 y.o. MRN: [id number]  CC: Chief Complaint  Patient presents with   Establish Care    Pt went to [facility] in [place] for rash    HPI:  20 year old female presents to establish care with me.  Patient states that she and her family recently suffered from scabies.  This is now resolved.  And has been thought that this was contracted in a motel.  Patient has a history of anemia.  She is not on any iron supplementation at this time.  No recent laboratory studies since having her baby.  Patient has a history of gestational hypertension.  No reported history of chronic hypertension.  BP slightly elevated today at 130/84.  Patient suffering from frequent migraine headaches.  She states that this is occurring nearly every day.  She states that she uses Tylenol and also just tries to sleep it off in a dark room.  Patient is currently breast-feeding.  Her significant other states that this is becoming a problem and she tends to downplay her symptoms.  Patient Active Problem List   Diagnosis Date Noted   Elevated BP without diagnosis of hypertension 11/28/2020   Encounter for physical examination, contraception, and Papanicolaou smear of cervix 07/25/2020   Postpartum depression & anxiety 05/10/2020   History of gestational hypertension 04/04/2020   Multiple nevi 07/26/2016   Migraine without aura and without status migrainosus, not intractable 01/23/2016   Episodic tension-type headache, not intractable 05/14/2015   Anxiety 03/31/2015   Anemia 03/02/2013    Social Hx   Social History   Socioeconomic History   Marital status: Single    Spouse name: Not on file   Number of children: Not on file   Years of education: Not on file   Highest education level: Not on file  Occupational History   Not on file  Tobacco Use   Smoking status: Never   Smokeless tobacco: Never   Tobacco comments:    mom and dad  smokes outside  Vaping Use   Vaping Use: Never used  Substance and Sexual Activity   Alcohol use: Not Currently   Drug use: Never   Sexual activity: Yes    Birth control/protection: Injection  Other Topics Concern   Not on file  Social History Narrative   . She is doing well.    She lives with her father, sister, and brother. Her brother is 36 yo and her sister is 82 yo.    She enjoys sleeping, watching Netflix and theater.   Social Determinants of Health   Financial Resource Strain: Low Risk    Difficulty of Paying Living Expenses: Not very hard  Food Insecurity: No Food Insecurity   Worried About Charity fundraiser in the Last Year: Never true   Ran Out of Food in the Last Year: Never true  Transportation Needs: No Transportation Needs   Lack of Transportation (Medical): No   Lack of Transportation (Non-Medical): No  Physical Activity: Insufficiently Active   Days of Exercise per Week: 4 days   Minutes of Exercise per Session: 20 min  Stress: Stress Concern Present   Feeling of Stress : To some extent  Social Connections: Socially Isolated   Frequency of Communication with Friends and Family: Once a week   Frequency of Social Gatherings with Friends and Family: Once a week   Attends Religious Services: Never   Retail buyer of [employer]  or Organizations: No   Attends Archivist Meetings: Never   Marital Status: Living with partner    Review of Systems Per HPI  Objective:  BP 130/84   Pulse (!) 132   Temp (!) 97.3 F (36.3 C)   Wt 169 lb 3.2 oz (76.7 kg)   SpO2 100%   BMI 24.99 kg/m   BP/Weight 11/28/2020 07/25/2020 06/09/7822  Systolic BP 235 361 443  Diastolic BP 84 87 84  Wt. (Lbs) 169.2 175 178.5  BMI 24.99 25.84 26.36    Physical Exam Vitals and nursing note reviewed.  Constitutional:      General: She is not in acute distress.    Appearance: Normal appearance. She is not ill-appearing.  HENT:     Head: Normocephalic and atraumatic.  Eyes:      General:        Right eye: No discharge.        Left eye: No discharge.     Conjunctiva/sclera: Conjunctivae normal.  Pulmonary:     Effort: Pulmonary effort is normal. No respiratory distress.  Neurological:     Mental Status: She is alert.  Psychiatric:     Comments: Flat affect.    Lab Results  Component Value Date   WBC 19.2 (H) 04/05/2020   HGB 10.5 (L) 04/05/2020   HCT 31.7 (L) 04/05/2020   PLT 183 04/05/2020   GLUCOSE 94 04/04/2020   ALT 11 04/04/2020   AST 20 04/04/2020   NA 135 04/04/2020   K 3.9 04/04/2020   CL 108 04/04/2020   CREATININE 0.59 04/04/2020   BUN 6 04/04/2020   CO2 17 (L) 04/04/2020     Assessment & Plan:   Problem List Items Addressed This Visit       Cardiovascular and Mediastinum   Migraine without aura and without status migrainosus, not intractable    Uncontrolled.  Therapeutic options limited in the setting of breast-feeding.  Phenergan as needed.  Advised Tylenol and ibuprofen.        Other   Elevated BP without diagnosis of hypertension    BP slightly elevated today.  Has a history of gestational hypertension.  We will continue to monitor closely.      Relevant Orders   CMP14+EGFR   Anemia - Primary    No reported heavy menstrual bleeding.  Will obtain CBC.      Relevant Orders   CBC   Other Visit Diagnoses     Screening, lipid       Relevant Orders   Lipid panel       Meds ordered this encounter  Medications   promethazine (PHENERGAN) 25 MG tablet    Sig: Take 1 tablet (25 mg total) by mouth every 8 (eight) hours as needed for nausea or vomiting (Migraine).    Dispense:  30 tablet    Refill:  1    Follow-up:  Return in about 6 months (around 05/28/2021) for Migraine; or sooner if needed.  [place]

## 2020-11-28 NOTE — Assessment & Plan Note (Signed)
BP slightly elevated today.  Has a history of gestational hypertension.  We will continue to monitor closely.

## 2020-12-10 ENCOUNTER — Other Ambulatory Visit: Payer: Self-pay

## 2020-12-10 ENCOUNTER — Ambulatory Visit: Payer: Medicaid Other | Admitting: *Deleted

## 2020-12-10 DIAGNOSIS — Z3042 Encounter for surveillance of injectable contraceptive: Secondary | ICD-10-CM | POA: Diagnosis not present

## 2020-12-10 MED ORDER — MEDROXYPROGESTERONE ACETATE 150 MG/ML IM SUSP
150.0000 mg | Freq: Once | INTRAMUSCULAR | Status: AC
  Administered 2020-12-10: 11:00:00 150 mg via INTRAMUSCULAR

## 2020-12-10 NOTE — Progress Notes (Signed)
   NURSE VISIT- INJECTION  SUBJECTIVE:  Samantha Patterson is a 20 y.o. G59P1001 female here for a Depo Provera for contraception/period management. She is a GYN patient.   OBJECTIVE:  There were no vitals taken for this visit.  Appears well, in no apparent distress  Injection administered in: Right deltoid  Meds ordered this encounter  Medications   medroxyPROGESTERone (DEPO-PROVERA) injection 150 mg    ASSESSMENT: GYN patient Depo Provera for contraception/period management PLAN: Follow-up: in 11-13 weeks for next Depo   Alice Rieger  12/10/2020 11:21 AM

## 2020-12-17 ENCOUNTER — Other Ambulatory Visit: Payer: Self-pay | Admitting: Family Medicine

## 2020-12-26 ENCOUNTER — Encounter: Payer: Self-pay | Admitting: Family Medicine

## 2020-12-27 ENCOUNTER — Other Ambulatory Visit: Payer: Self-pay | Admitting: Family Medicine

## 2020-12-27 MED ORDER — PERMETHRIN 5 % EX CREA: TOPICAL_CREAM | CUTANEOUS | 0 refills | Status: AC

## 2021-03-04 ENCOUNTER — Ambulatory Visit: Payer: Medicaid Other

## 2021-03-06 ENCOUNTER — Ambulatory Visit: Payer: Medicaid Other

## 2021-12-25 ENCOUNTER — Ambulatory Visit (INDEPENDENT_AMBULATORY_CARE_PROVIDER_SITE_OTHER): Payer: Medicaid Other | Admitting: *Deleted

## 2021-12-25 VITALS — BP 131/87 | HR 91 | Wt 179.0 lb

## 2021-12-25 DIAGNOSIS — Z3201 Encounter for pregnancy test, result positive: Secondary | ICD-10-CM

## 2021-12-25 DIAGNOSIS — Z789 Other specified health status: Secondary | ICD-10-CM | POA: Diagnosis not present

## 2021-12-25 DIAGNOSIS — N926 Irregular menstruation, unspecified: Secondary | ICD-10-CM

## 2021-12-25 LAB — POCT URINE PREGNANCY: Preg Test, Ur: POSITIVE — AB

## 2021-12-25 NOTE — Progress Notes (Signed)
   NURSE VISIT- PREGNANCY CONFIRMATION   SUBJECTIVE:  Samantha Patterson is a 21 y.o. G1P1001 female at Unknown by uncertain LMP. Here for pregnancy confirmation. States she her last regular period was 10/28-11/1.  Had intercourse on 11/2.  She took Plan B 3 days later and then bled from 11/9-11/13.  Had intercourse again on 11/29 but pregnancy test on 12/2 was negative but was supposed to start her next cycle around then.  Waited until a couple days ago and test positive x 2. She reports no complaints.  She is not taking prenatal vitamins.    OBJECTIVE:  BP 131/87 (BP Location: Left Arm, Patient Position: Sitting, Cuff Size: Normal)   Pulse 91   Wt 179 lb (81.2 kg)   LMP  (LMP Unknown)   Breastfeeding No   BMI 26.43 kg/m   Appears well, in no apparent distress  Results for orders placed or performed in visit on 12/25/21 (from the past 24 hour(s))  POCT urine pregnancy   Collection Time: 12/25/21  3:33 PM  Result Value Ref Range   Preg Test, Ur Positive (A) Negative    ASSESSMENT: Positive pregnancy test, Unknown by LMP, HCG ordered  PLAN: Schedule for dating ultrasound: TBD based on HCG Prenatal vitamins:  unsure if she will begin as she did not take last pregnancy.    Nausea medicines: not currently needed   OB packet given: Yes  Alice Rieger  12/25/2021 3:45 PM

## 2021-12-26 LAB — BETA HCG QUANT (REF LAB): hCG Quant: 170 m[IU]/mL

## 2022-01-27 ENCOUNTER — Other Ambulatory Visit: Payer: Self-pay | Admitting: Obstetrics & Gynecology

## 2022-01-27 DIAGNOSIS — O3680X0 Pregnancy with inconclusive fetal viability, not applicable or unspecified: Secondary | ICD-10-CM

## 2022-01-28 ENCOUNTER — Other Ambulatory Visit: Payer: Medicaid Other

## 2022-01-28 ENCOUNTER — Encounter: Payer: Medicaid Other | Admitting: *Deleted
